# Patient Record
Sex: Male | Born: 1970 | Race: White | Hispanic: No | Marital: Married | Smoking: Never smoker
Health system: Southern US, Community
[De-identification: ages and names within clinical notes are randomized; demographics above are authoritative.]

## PROBLEM LIST (undated history)

## (undated) DIAGNOSIS — R51 Headache: Secondary | ICD-10-CM

## (undated) DIAGNOSIS — R519 Headache, unspecified: Secondary | ICD-10-CM

## (undated) DIAGNOSIS — J302 Other seasonal allergic rhinitis: Secondary | ICD-10-CM

## (undated) DIAGNOSIS — T7840XA Allergy, unspecified, initial encounter: Secondary | ICD-10-CM

## (undated) DIAGNOSIS — J45909 Unspecified asthma, uncomplicated: Secondary | ICD-10-CM

## (undated) HISTORY — DX: Allergy, unspecified, initial encounter: T78.40XA

## (undated) HISTORY — DX: Headache: R51

## (undated) HISTORY — DX: Headache, unspecified: R51.9

## (undated) HISTORY — DX: Unspecified asthma, uncomplicated: J45.909

## (undated) HISTORY — DX: Other seasonal allergic rhinitis: J30.2

---

## 2008-05-13 HISTORY — PX: KNEE SURGERY: SHX244

## 2009-09-20 ENCOUNTER — Emergency Department (HOSPITAL_COMMUNITY): Admission: EM | Admit: 2009-09-20 | Discharge: 2009-09-20 | Payer: Self-pay | Admitting: Emergency Medicine

## 2010-06-29 ENCOUNTER — Other Ambulatory Visit (HOSPITAL_COMMUNITY): Payer: Self-pay | Admitting: Family Medicine

## 2010-06-29 DIAGNOSIS — R110 Nausea: Secondary | ICD-10-CM

## 2010-06-29 DIAGNOSIS — R109 Unspecified abdominal pain: Secondary | ICD-10-CM

## 2010-06-29 DIAGNOSIS — R1011 Right upper quadrant pain: Secondary | ICD-10-CM

## 2010-07-02 ENCOUNTER — Other Ambulatory Visit (HOSPITAL_COMMUNITY): Payer: Self-pay | Admitting: Family Medicine

## 2010-07-02 ENCOUNTER — Ambulatory Visit (HOSPITAL_COMMUNITY)
Admission: RE | Admit: 2010-07-02 | Discharge: 2010-07-02 | Disposition: A | Discharge status: Home / Self Care | Payer: BC Managed Care – PPO | Source: Ambulatory Visit | Attending: Family Medicine | Admitting: Family Medicine

## 2010-07-02 ENCOUNTER — Encounter (HOSPITAL_COMMUNITY): Payer: Self-pay

## 2010-07-02 DIAGNOSIS — D739 Disease of spleen, unspecified: Secondary | ICD-10-CM | POA: Insufficient documentation

## 2010-07-02 DIAGNOSIS — R1011 Right upper quadrant pain: Secondary | ICD-10-CM

## 2010-07-02 DIAGNOSIS — R110 Nausea: Secondary | ICD-10-CM

## 2010-07-02 DIAGNOSIS — R109 Unspecified abdominal pain: Secondary | ICD-10-CM

## 2010-07-02 DIAGNOSIS — M549 Dorsalgia, unspecified: Secondary | ICD-10-CM

## 2010-07-02 DIAGNOSIS — R10A0 Flank pain, unspecified side: Secondary | ICD-10-CM

## 2010-07-02 DIAGNOSIS — R1031 Right lower quadrant pain: Secondary | ICD-10-CM | POA: Insufficient documentation

## 2010-07-02 DIAGNOSIS — R0609 Other forms of dyspnea: Secondary | ICD-10-CM

## 2010-07-02 MED ORDER — IOHEXOL 300 MG/ML  SOLN
100.0000 mL | Freq: Once | INTRAMUSCULAR | Status: AC | PRN
  Administered 2010-07-02: 100 mL via INTRAVENOUS

## 2010-07-05 ENCOUNTER — Other Ambulatory Visit (HOSPITAL_COMMUNITY): Payer: Self-pay | Admitting: Family Medicine

## 2010-07-11 ENCOUNTER — Ambulatory Visit (HOSPITAL_COMMUNITY)
Admission: RE | Admit: 2010-07-11 | Discharge: 2010-07-11 | Disposition: A | Discharge status: Home / Self Care | Payer: BC Managed Care – PPO | Source: Ambulatory Visit | Attending: Family Medicine | Admitting: Family Medicine

## 2010-07-11 DIAGNOSIS — D7389 Other diseases of spleen: Secondary | ICD-10-CM | POA: Insufficient documentation

## 2010-07-11 MED ORDER — GADOBENATE DIMEGLUMINE 529 MG/ML IV SOLN
15.0000 mL | Freq: Once | INTRAVENOUS | Status: AC
  Administered 2010-07-11: 15 mL via INTRAVENOUS

## 2012-04-17 ENCOUNTER — Ambulatory Visit (HOSPITAL_COMMUNITY)
Admission: RE | Admit: 2012-04-17 | Discharge: 2012-04-17 | Disposition: A | Discharge status: Home / Self Care | Payer: BC Managed Care – PPO | Source: Ambulatory Visit | Attending: Family Medicine | Admitting: Family Medicine

## 2012-04-17 ENCOUNTER — Other Ambulatory Visit: Payer: Self-pay | Admitting: Family Medicine

## 2012-04-17 DIAGNOSIS — M549 Dorsalgia, unspecified: Secondary | ICD-10-CM

## 2012-10-07 ENCOUNTER — Encounter: Payer: Self-pay | Admitting: *Deleted

## 2012-10-12 ENCOUNTER — Encounter: Payer: Self-pay | Admitting: Family Medicine

## 2012-10-12 ENCOUNTER — Ambulatory Visit (INDEPENDENT_AMBULATORY_CARE_PROVIDER_SITE_OTHER): Payer: BC Managed Care – PPO | Admitting: Family Medicine

## 2012-10-12 VITALS — BP 112/70 | HR 80 | Wt 171.1 lb

## 2012-10-12 DIAGNOSIS — Z0000 Encounter for general adult medical examination without abnormal findings: Secondary | ICD-10-CM

## 2012-10-12 MED ORDER — FLUTICASONE PROPIONATE 50 MCG/ACT NA SUSP: 2.0000 | Freq: Every day | NASAL | Status: DC | PRN

## 2012-10-12 NOTE — Progress Notes (Signed)
  Subjective:    Patient ID: Angel Baker, male    DOB: March 30, 1971, 42 y.o.   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 Has no particular complaints currently. Does have allergies use medication does help him keep it under control. He is up-to-date on tetanus shot. Denies any chest tightness pressure pain shortness of breath swelling in the legs rectal bleeding or hematuria.    Review of Systems  Constitutional: Negative for fever, activity change and appetite change.  HENT: Negative for congestion, rhinorrhea and neck pain.   Eyes: Negative for discharge.  Respiratory: Negative for cough and wheezing.   Cardiovascular: Negative for chest pain.  Gastrointestinal: Negative for vomiting, abdominal pain and blood in stool.  Genitourinary: Negative for frequency and difficulty urinating.  Skin: Negative for rash.  Allergic/Immunologic: Negative for environmental allergies and food allergies.  Neurological: Negative for weakness and headaches.  Psychiatric/Behavioral: Negative for agitation.       Objective:   Physical Exam  Vitals reviewed. Constitutional: He appears well-developed and well-nourished.  HENT:  Head: Normocephalic and atraumatic.  Right Ear: External ear normal.  Left Ear: External ear normal.  Nose: Nose normal.  Mouth/Throat: Oropharynx is clear and moist.  Eyes: EOM are normal. Pupils are equal, round, and reactive to light.  Neck: Normal range of motion. Neck supple. No thyromegaly present.  Cardiovascular: Normal rate, regular rhythm and normal heart sounds.   No murmur heard. Pulmonary/Chest: Effort normal and breath sounds normal. No respiratory distress. He has no wheezes.  Abdominal: Soft. Bowel sounds are normal. He exhibits no distension and no mass. There is no tenderness.  Genitourinary: Penis normal.  Musculoskeletal: Normal range of motion. He exhibits no edema.  Lymphadenopathy:    He has no cervical adenopathy.  Neurological: He  is alert. He exhibits normal muscle tone.  Skin: Skin is warm and dry. No erythema.  Psychiatric: He has a normal mood and affect. His behavior is normal. Judgment normal.          Assessment & Plan:  Wellness exam-labs recommended, up-to-date on tetanus, no warning signs noted. No other testing necessary. Continue allergy medicine. Followup yearly exam. Approved for scouts.

## 2012-10-19 LAB — LIPID PANEL
LDL Cholesterol: 100 mg/dL — ABNORMAL HIGH (ref 0–99)
Total CHOL/HDL Ratio: 3.3 Ratio
VLDL: 10 mg/dL (ref 0–40)

## 2012-10-19 LAB — GLUCOSE, RANDOM: Glucose, Bld: 99 mg/dL (ref 70–99)

## 2012-10-20 ENCOUNTER — Encounter: Payer: Self-pay | Admitting: Family Medicine

## 2013-09-06 ENCOUNTER — Encounter: Payer: Self-pay | Admitting: Family Medicine

## 2013-09-06 ENCOUNTER — Ambulatory Visit (HOSPITAL_COMMUNITY)
Admission: RE | Admit: 2013-09-06 | Discharge: 2013-09-06 | Disposition: A | Discharge status: Home / Self Care | Payer: BC Managed Care – PPO | Source: Ambulatory Visit | Attending: Family Medicine | Admitting: Family Medicine

## 2013-09-06 ENCOUNTER — Ambulatory Visit (INDEPENDENT_AMBULATORY_CARE_PROVIDER_SITE_OTHER): Payer: BC Managed Care – PPO | Admitting: Family Medicine

## 2013-09-06 VITALS — BP 122/80 | Ht 66.0 in | Wt 173.0 lb

## 2013-09-06 DIAGNOSIS — M79641 Pain in right hand: Secondary | ICD-10-CM

## 2013-09-06 DIAGNOSIS — R0789 Other chest pain: Secondary | ICD-10-CM

## 2013-09-06 DIAGNOSIS — M549 Dorsalgia, unspecified: Secondary | ICD-10-CM | POA: Insufficient documentation

## 2013-09-06 DIAGNOSIS — M546 Pain in thoracic spine: Secondary | ICD-10-CM

## 2013-09-06 DIAGNOSIS — E785 Hyperlipidemia, unspecified: Secondary | ICD-10-CM

## 2013-09-06 DIAGNOSIS — M79609 Pain in unspecified limb: Secondary | ICD-10-CM

## 2013-09-06 DIAGNOSIS — R29898 Other symptoms and signs involving the musculoskeletal system: Secondary | ICD-10-CM | POA: Insufficient documentation

## 2013-09-06 DIAGNOSIS — R109 Unspecified abdominal pain: Secondary | ICD-10-CM

## 2013-09-06 LAB — POCT URINALYSIS DIPSTICK: pH, UA: 6

## 2013-09-06 LAB — LIPID PANEL
CHOL/HDL RATIO: 4.1 ratio
Cholesterol: 190 mg/dL (ref 0–200)
HDL: 46 mg/dL (ref >39)
LDL Cholesterol: 127 mg/dL — ABNORMAL HIGH (ref 0–99)
TRIGLYCERIDES: 84 mg/dL (ref <150)
VLDL: 17 mg/dL (ref 0–40)

## 2013-09-06 LAB — CBC WITH DIFFERENTIAL/PLATELET
BASOS ABS: 0.1 10*3/uL (ref 0.0–0.1)
Basophils Relative: 1 % (ref 0–1)
EOS PCT: 3 % (ref 0–5)
Eosinophils Absolute: 0.2 10*3/uL (ref 0.0–0.7)
HEMATOCRIT: 47.4 % (ref 39.0–52.0)
Hemoglobin: 16.8 g/dL (ref 13.0–17.0)
LYMPHS ABS: 1.4 10*3/uL (ref 0.7–4.0)
LYMPHS PCT: 26 % (ref 12–46)
MCH: 29.5 pg (ref 26.0–34.0)
MCHC: 35.4 g/dL (ref 30.0–36.0)
MCV: 83.3 fL (ref 78.0–100.0)
MONOS PCT: 9 % (ref 3–12)
Monocytes Absolute: 0.5 10*3/uL (ref 0.1–1.0)
NEUTROS ABS: 3.2 10*3/uL (ref 1.7–7.7)
Neutrophils Relative %: 61 % (ref 43–77)
Platelets: 165 10*3/uL (ref 150–400)
RBC: 5.69 MIL/uL (ref 4.22–5.81)
RDW: 13.4 % (ref 11.5–15.5)
WBC: 5.2 10*3/uL (ref 4.0–10.5)

## 2013-09-06 LAB — HEPATIC FUNCTION PANEL
ALBUMIN: 4.8 g/dL (ref 3.5–5.2)
ALT: 17 U/L (ref 0–53)
AST: 16 U/L (ref 0–37)
Alkaline Phosphatase: 49 U/L (ref 39–117)
BILIRUBIN DIRECT: 0.1 mg/dL (ref 0.0–0.3)
BILIRUBIN TOTAL: 0.7 mg/dL (ref 0.2–1.2)
Indirect Bilirubin: 0.6 mg/dL (ref 0.2–1.2)
TOTAL PROTEIN: 6.9 g/dL (ref 6.0–8.3)

## 2013-09-06 LAB — BASIC METABOLIC PANEL
BUN: 12 mg/dL (ref 6–23)
CO2: 27 mEq/L (ref 19–32)
Calcium: 9.4 mg/dL (ref 8.4–10.5)
Chloride: 105 mEq/L (ref 96–112)
Creat: 0.98 mg/dL (ref 0.50–1.35)
GLUCOSE: 99 mg/dL (ref 70–99)
Potassium: 4.4 mEq/L (ref 3.5–5.3)
Sodium: 141 mEq/L (ref 135–145)

## 2013-09-06 NOTE — Progress Notes (Signed)
   Subjective:    Patient ID: Angel Baker, male    DOB: 1970-09-20, 43 y.o.   MRN: [id number]  Back Pain This is a recurrent problem. The current episode started more than 1 year ago. Radiates to: right side. The pain is worse during the night. (Tightness in chest) He has tried NSAIDs for the symptoms. The treatment provided mild relief.    Long discussion held patient greater than 25 minutes spent with patient.  Review of Systems  Musculoskeletal: Positive for back pain.   He denies coughing he does relate some chest tightness more so when he is doing physical activity. He denies severe abdominal pain but he gets a pain that starts in the mid back region radiates around to the right side. This is not associated with eating. Does not seem to be worse with movements.    Objective:   Physical Exam Lungs are clear heart is regular low spine minimal tenderness flanks nontender abdomen is soft no guarding or rebound. Extremities no edema. Neurologic grossly normal.       Assessment & Plan:  1. Chest tightness The chest discomfort he is unlikely to be cardiac but the patient relates that he's tried to do some increase exercise and he has noticed significant shortness of breath with it along with the chest tightness so it is concerning for the possibility I believe this patient would benefit from seeing cardiology for consultation. At the very least they can do a stress test make sure that looks good before he embarks on a vigorous exercise program - PR ELECTROCARDIOGRAM, COMPLETE; Standing - PR ELECTROCARDIOGRAM, COMPLETE  2. Thoracic back pain This flank discomfort that he is having I believe is more than likely related to his thoracic spine. We will do some x-rays that this may end up needing to have the MRI as well. The pain does not change with eating slight doubt that it's gallbladder. Also urinalysis was negative for any blood. Patient had a CT scan several years ago which was  negative for any type of kidney or liver abnormality - POCT urinalysis dipstick - DG Thoracic Spine W/Swimmers  3. Hand pain, right He has a cyst in his right hand this is going to need to see a hand specialist in be taking care. - DG Hand Complete Right; Future  4. Other and unspecified hyperlipidemia He's had mild hyperlipidemia in the past we will check profile await the results to us. - Lipid panel  5. Abdominal pain, unspecified site With the flank discomfort he is having we will do some lab work. - Hepatic function panel - Basic metabolic panel - CBC with Differential

## 2013-09-08 ENCOUNTER — Other Ambulatory Visit: Payer: Self-pay | Admitting: Family Medicine

## 2013-09-08 DIAGNOSIS — R0789 Other chest pain: Secondary | ICD-10-CM

## 2013-09-08 DIAGNOSIS — M79643 Pain in unspecified hand: Secondary | ICD-10-CM

## 2013-09-09 ENCOUNTER — Encounter: Payer: Self-pay | Admitting: Family Medicine

## 2013-09-13 ENCOUNTER — Ambulatory Visit (HOSPITAL_COMMUNITY)
Admission: RE | Admit: 2013-09-13 | Discharge: 2013-09-13 | Disposition: A | Discharge status: Home / Self Care | Payer: BC Managed Care – PPO | Source: Ambulatory Visit | Attending: Family Medicine | Admitting: Family Medicine

## 2013-09-13 DIAGNOSIS — M5124 Other intervertebral disc displacement, thoracic region: Secondary | ICD-10-CM | POA: Insufficient documentation

## 2013-09-13 DIAGNOSIS — M546 Pain in thoracic spine: Secondary | ICD-10-CM | POA: Insufficient documentation

## 2013-09-13 DIAGNOSIS — M47814 Spondylosis without myelopathy or radiculopathy, thoracic region: Secondary | ICD-10-CM | POA: Insufficient documentation

## 2013-09-13 DIAGNOSIS — M8448XA Pathological fracture, other site, initial encounter for fracture: Secondary | ICD-10-CM | POA: Insufficient documentation

## 2013-09-23 ENCOUNTER — Encounter: Payer: Self-pay | Admitting: Family Medicine

## 2013-09-24 ENCOUNTER — Ambulatory Visit (INDEPENDENT_AMBULATORY_CARE_PROVIDER_SITE_OTHER): Payer: BC Managed Care – PPO | Admitting: Cardiology

## 2013-09-24 ENCOUNTER — Encounter: Payer: Self-pay | Admitting: Cardiology

## 2013-09-24 VITALS — BP 125/75 | HR 63 | Ht 66.0 in | Wt 171.0 lb

## 2013-09-24 DIAGNOSIS — R072 Precordial pain: Secondary | ICD-10-CM | POA: Insufficient documentation

## 2013-09-24 DIAGNOSIS — J45909 Unspecified asthma, uncomplicated: Secondary | ICD-10-CM | POA: Insufficient documentation

## 2013-09-24 NOTE — Assessment & Plan Note (Signed)
Symptoms as outlined above, described as a "tightness" associated with shortness of breath, generally a sense that he has to take a deep breath in. Note purely exertional symptoms. Father had history of carotid artery disease and stroke. ECG reviewed above. He has never undergone any prior ischemic testing. Reports his symptoms are new in the last few months. Will obtain an exercise echocardiogram for objective evaluation. If this is low risk, consideration could be given to possibility that this might represent low-level reactive airways disease or perhaps exacerbation of allergy symptoms with the recent heavy pollen season. We will call him with the results.

## 2013-09-24 NOTE — Patient Instructions (Signed)
Your physician recommends that you schedule a follow-up appointment in: to be determined.We will call you with test results    Your physician recommends that you continue on your current medications as directed. Please refer to the Current Medication list given to you today.    Your physician has requested that you have a stress echocardiogram. For further information please visit [email or web address]. Please follow instruction sheet as given.    Thank you for choosing [facility] !

## 2013-09-24 NOTE — Assessment & Plan Note (Signed)
Mainly reports symptoms as a child. He no longer uses inhalers.

## 2013-09-24 NOTE — Progress Notes (Signed)
Clinical Summary [name] is a 43 y.o.male referred for cardiology consultation by Dr.Luking. He is reviewed below. He states that over the last few months he has been experiencing a "tightness" in his chest that is most noticeable in the morning when he gets up. As the day goes on and he is more active, symptoms are less evident unless he is particularly vigorous in his activity. He feels short of breath with this discomfort, like he has to take a deep breath in to feel satisfied. He does have a history of childhood asthma, no longer uses inhalers, also seasonal allergies.  Recent lab work from April showed cholesterol 190, triglycerides 84, HDL 46, LDL 127, normal LFTs, potassium 4.4, BUN 12, creatinine 0.9, hemoglobin 16.8, platelets 165. The recent ECG done in Dr. Fletcher AnonLuking's office is not available for my review today.  ECG today shows sinus arrhythmia with PVC, otherwise normal.  No major known cardiac risk factors. His father did have PAD with carotid artery disease and history of stroke, otherwise no obvious premature CAD in the family.  He has never gone undergone a prior objective ischemic testing.   No Known Allergies  Current Outpatient Prescriptions  Medication Sig Dispense Refill  . cetirizine (ZYRTEC) 10 MG tablet Take 10 mg by mouth daily.      . fluticasone (FLONASE) 50 MCG/ACT nasal spray Place 2 sprays into the nose daily as needed for rhinitis.  16 g  12   No current facility-administered medications for this visit.    Past Medical History  Diagnosis Date  . Asthma   . Seasonal allergies     History reviewed. No pertinent past surgical history.  Family History  Problem Relation Age of Onset  . Hypertension Father   . Stroke Father     Carotid artery disease    Social History Angel Baker reports that he has never smoked. He does not have any smokeless tobacco history on file. [name] reports that he does not drink alcohol.  Review of Systems No  palpitations, syncope, regular wheezing, hospitalizations for asthma the last several years. Stable appetite. Has gained some weight over the winter. Otherwise negative except as outlined.  Physical Examination Filed Vitals:   09/24/13 0915  BP: 125/75  Pulse: 63   Filed Weights   09/24/13 0915  Weight: 171 lb (77.565 kg)   Patient appears comfortable at rest. HEENT: Conjunctiva and lids normal, oropharynx clear. Neck: Supple, no elevated JVP or carotid bruits, no thyromegaly. Lungs: Clear to auscultation, nonlabored breathing at rest. Cardiac: Regular rate and rhythm with appropriate respiratory variation, no S3 or significant systolic murmur, no pericardial rub. Abdomen: Soft, nontender, bowel sounds present. Extremities: No pitting edema, distal pulses 2+. Skin: Warm and dry. Musculoskeletal: No kyphosis. Neuropsychiatric: Alert and oriented x3, affect grossly appropriate.   Problem List and Plan   Precordial pain Symptoms as outlined above, described as a "tightness" associated with shortness of breath, generally a sense that he has to take a deep breath in. Note purely exertional symptoms. Father had history of carotid artery disease and stroke. ECG reviewed above. He has never undergone any prior ischemic testing. Reports his symptoms are new in the last few months. Will obtain an exercise echocardiogram for objective evaluation. If this is low risk, consideration could be given to possibility that this might represent low-level reactive airways disease or perhaps exacerbation of allergy symptoms with the recent heavy pollen season. We will call him with the results.  Asthma  Mainly reports symptoms as a child. He no longer uses inhalers.    Angel SidleSamuel G. Baker, M.D., F.A.C.C.

## 2013-09-29 ENCOUNTER — Encounter (HOSPITAL_COMMUNITY): Payer: Self-pay

## 2013-09-29 ENCOUNTER — Ambulatory Visit (HOSPITAL_COMMUNITY)
Admission: RE | Admit: 2013-09-29 | Discharge: 2013-09-29 | Disposition: A | Discharge status: Home / Self Care | Payer: BC Managed Care – PPO | Source: Ambulatory Visit | Attending: Cardiology | Admitting: Cardiology

## 2013-09-29 DIAGNOSIS — R079 Chest pain, unspecified: Secondary | ICD-10-CM | POA: Insufficient documentation

## 2013-09-29 DIAGNOSIS — R5383 Other fatigue: Secondary | ICD-10-CM

## 2013-09-29 DIAGNOSIS — R0602 Shortness of breath: Secondary | ICD-10-CM | POA: Insufficient documentation

## 2013-09-29 DIAGNOSIS — R072 Precordial pain: Secondary | ICD-10-CM

## 2013-09-29 DIAGNOSIS — R5381 Other malaise: Secondary | ICD-10-CM | POA: Insufficient documentation

## 2013-09-29 NOTE — Progress Notes (Signed)
Stress Lab Nurses Notes - Angel Baker  [name] 09/29/2013 Reason for doing test: chest tightness with SOB Type of test: Stress Echo Nurse performing test: Parke PoissonPhyllis Billingsly, RN Nuclear Medicine Tech: Not Applicable Echo Tech: Veda Canningindy Rigg MD performing test: Koneswaran/M.Geni BersLenze PA Family MD: Lilyan PuntScott Luking Test explained and consent signed: yes IV started: No IV started Symptoms: Fatigue in legs Treatment/Intervention: None Reason test stopped: fatigue After recovery IV was: NA Patient to return to Nuc. Med at : NA Patient discharged: Home Patient's Condition upon discharge was: stable Comments: During test peak BP 179/78 & HR 190.  Recovery BP 150/83 & HR 112.  Symptoms resolved in recovery. Tawni MillersPhyllis T Edmundo Tedesco

## 2013-09-29 NOTE — Progress Notes (Signed)
  Echocardiogram 2D Echocardiogram has been performed.  Mariah Gerstenberger L Ciria Bernardini 09/29/2013, 10:20 AM

## 2014-12-28 ENCOUNTER — Encounter: Payer: Self-pay | Admitting: Family Medicine

## 2014-12-28 ENCOUNTER — Ambulatory Visit (INDEPENDENT_AMBULATORY_CARE_PROVIDER_SITE_OTHER): Payer: BLUE CROSS/BLUE SHIELD | Admitting: Family Medicine

## 2014-12-28 VITALS — BP 128/82 | Temp 99.2°F | Ht 66.0 in | Wt 168.0 lb

## 2014-12-28 DIAGNOSIS — R140 Abdominal distension (gaseous): Secondary | ICD-10-CM | POA: Diagnosis not present

## 2014-12-28 DIAGNOSIS — K2990 Gastroduodenitis, unspecified, without bleeding: Secondary | ICD-10-CM | POA: Diagnosis not present

## 2014-12-28 DIAGNOSIS — K2970 Gastritis, unspecified, without bleeding: Secondary | ICD-10-CM

## 2014-12-28 DIAGNOSIS — R1013 Epigastric pain: Secondary | ICD-10-CM

## 2014-12-28 MED ORDER — PANTOPRAZOLE SODIUM 40 MG PO TBEC: 40.0000 mg | DELAYED_RELEASE_TABLET | Freq: Every day | ORAL | Status: DC

## 2014-12-28 NOTE — Progress Notes (Addendum)
   Subjective:    Patient ID: Angel Baker, male    DOB: [birth year], 44 y.o.   MRN: [id number]  Abdominal Pain Episode onset: 2 months ago. The pain is located in the epigastric region. The quality of the pain is dull. Associated symptoms include nausea. Pertinent negatives include no constipation or diarrhea. He has tried nothing for the symptoms.  temp today 99.2.   this patient has been having a few months worth of epigastric wellness as well as bloating sensation some intermittent epigastric pain he takes anti-inflammatory is about 3 times a week he denies nausea or vomiting the patient denies alcohol use does not smoke. Does not have a history of ulcers. The pain is severe enough to where he takes Aleve 2 or 3 times a week to help patient states eating does not help or heard. Movement does not help or heard. The pain has become more progressive over the past several weeks in started a few months ago.  denies rectal bleeding denies weight loss Review of Systems  HENT: Negative for congestion.   Respiratory: Negative for cough.   Gastrointestinal: Positive for nausea and abdominal pain. Negative for diarrhea and constipation.       Objective:   Physical Exam   lungs clear heart regular abdomen soft minimal epigastric tenderness no guarding or rebound no masses      Assessment & Plan:   lab work order  PPI ordered  avoid  Anti-inflammatory's  patient to give [place] update in 2-3 weeks If ongoing troubles next step would be consideration for ultrasound as well as gastroenterology consult patient  possible gastritis may end up needing EGD  I doubt cancer, we will do testing may need scan or ultrasound if testing abnormal   Post note-lab work came back showing slight elevation of lipase. Raises into question pancreatitis with significant intermittent pain over the past few months that is getting progressively worse along with bloating sensation in the abdomen I feel that a CAT scan is  necessary for this patient to help delineate what is going on.

## 2014-12-29 LAB — LIPASE: LIPASE: 85 U/L — AB (ref 0–59)

## 2014-12-29 LAB — CBC WITH DIFFERENTIAL/PLATELET
BASOS ABS: 0 10*3/uL (ref 0.0–0.2)
Basos: 1 %
EOS (ABSOLUTE): 0.1 10*3/uL (ref 0.0–0.4)
Eos: 2 %
HEMOGLOBIN: 16.2 g/dL (ref 12.6–17.7)
Hematocrit: 46.8 % (ref 37.5–51.0)
Immature Grans (Abs): 0 10*3/uL (ref 0.0–0.1)
Immature Granulocytes: 0 %
LYMPHS ABS: 1.3 10*3/uL (ref 0.7–3.1)
Lymphs: 26 %
MCH: 29.9 pg (ref 26.6–33.0)
MCHC: 34.6 g/dL (ref 31.5–35.7)
MCV: 87 fL (ref 79–97)
MONOCYTES: 9 %
MONOS ABS: 0.5 10*3/uL (ref 0.1–0.9)
NEUTROS ABS: 3.3 10*3/uL (ref 1.4–7.0)
Neutrophils: 62 %
Platelets: 170 10*3/uL (ref 150–379)
RBC: 5.41 x10E6/uL (ref 4.14–5.80)
RDW: 13.7 % (ref 12.3–15.4)
WBC: 5.2 10*3/uL (ref 3.4–10.8)

## 2014-12-29 LAB — HEPATIC FUNCTION PANEL
ALT: 19 IU/L (ref 0–44)
AST: 18 IU/L (ref 0–40)
Albumin: 4.8 g/dL (ref 3.5–5.5)
Alkaline Phosphatase: 53 IU/L (ref 39–117)
BILIRUBIN TOTAL: 0.8 mg/dL (ref 0.0–1.2)
BILIRUBIN, DIRECT: 0.19 mg/dL (ref 0.00–0.40)
TOTAL PROTEIN: 6.9 g/dL (ref 6.0–8.5)

## 2014-12-29 LAB — BASIC METABOLIC PANEL
BUN / CREAT RATIO: 13 (ref 9–20)
BUN: 13 mg/dL (ref 6–24)
CO2: 26 mmol/L (ref 18–29)
CREATININE: 0.99 mg/dL (ref 0.76–1.27)
Calcium: 9.5 mg/dL (ref 8.7–10.2)
Chloride: 101 mmol/L (ref 97–108)
GFR calc Af Amer: 106 mL/min/{1.73_m2} (ref >59)
GFR calc non Af Amer: 92 mL/min/{1.73_m2} (ref >59)
GLUCOSE: 94 mg/dL (ref 65–99)
Potassium: 4.5 mmol/L (ref 3.5–5.2)
Sodium: 141 mmol/L (ref 134–144)

## 2014-12-29 LAB — AMYLASE: AMYLASE: 105 U/L (ref 31–124)

## 2014-12-30 ENCOUNTER — Other Ambulatory Visit: Payer: Self-pay

## 2014-12-30 DIAGNOSIS — R109 Unspecified abdominal pain: Secondary | ICD-10-CM

## 2015-01-04 ENCOUNTER — Encounter: Payer: Self-pay | Admitting: Internal Medicine

## 2015-01-04 ENCOUNTER — Encounter (HOSPITAL_COMMUNITY): Payer: Self-pay

## 2015-01-04 ENCOUNTER — Other Ambulatory Visit: Payer: Self-pay | Admitting: *Deleted

## 2015-01-04 ENCOUNTER — Ambulatory Visit (HOSPITAL_COMMUNITY)
Admission: RE | Admit: 2015-01-04 | Discharge: 2015-01-04 | Disposition: A | Discharge status: Home / Self Care | Payer: BLUE CROSS/BLUE SHIELD | Source: Ambulatory Visit | Attending: Family Medicine | Admitting: Family Medicine

## 2015-01-04 DIAGNOSIS — R109 Unspecified abdominal pain: Secondary | ICD-10-CM | POA: Insufficient documentation

## 2015-01-04 DIAGNOSIS — D739 Disease of spleen, unspecified: Secondary | ICD-10-CM | POA: Diagnosis not present

## 2015-01-04 MED ORDER — IOHEXOL 300 MG/ML  SOLN
100.0000 mL | Freq: Once | INTRAMUSCULAR | Status: AC | PRN
  Administered 2015-01-04: 100 mL via INTRAVENOUS

## 2015-01-05 LAB — AMYLASE: AMYLASE: 75 U/L (ref 31–124)

## 2015-01-05 LAB — LIPASE: LIPASE: 37 U/L (ref 0–59)

## 2015-01-11 ENCOUNTER — Ambulatory Visit (INDEPENDENT_AMBULATORY_CARE_PROVIDER_SITE_OTHER): Payer: BLUE CROSS/BLUE SHIELD | Admitting: Internal Medicine

## 2015-01-11 ENCOUNTER — Encounter: Payer: Self-pay | Admitting: Internal Medicine

## 2015-01-11 VITALS — BP 124/72 | HR 68 | Ht 65.5 in | Wt 168.0 lb

## 2015-01-11 DIAGNOSIS — R1013 Epigastric pain: Secondary | ICD-10-CM | POA: Diagnosis not present

## 2015-01-11 DIAGNOSIS — R748 Abnormal levels of other serum enzymes: Secondary | ICD-10-CM | POA: Diagnosis not present

## 2015-01-11 NOTE — Progress Notes (Signed)
  Referred by Dr. Lilyan Punt Subjective:    Patient ID: Angel Baker, male    DOB: 1970/10/10, 44 y.o.   MRN: [id number] Cc; epigastric pain HPI Is a very nice middle-aged married white man with a 2 month history of epigastric pain. Mild discomfort with bloating and some mild early satiety. No bleeding or dysphagia. Had mildly elvalted lipase but other CBC, CMEt ok. Amylsae ok. CT abd/pelvis negative. No bowel habit changes. Otherwise GI ROS negative. Started on PPI 2 weeks ago " I think I am a little better"  Wt Readings from Last 3 Encounters:  01/11/15 168 lb (76.204 kg)  12/28/14 168 lb (76.204 kg)  09/24/13 171 lb (77.565 kg)   Medications, allergies, past medical history, past surgical history, family history and social history are reviewed and updated in the EMR. Negative chest pain evaluation 2015 Review of Systems All other ROS negative or per HPI    Objective:   Physical Exam  124/72 mmHg  Pulse 68  Ht 5' 5.5" (1.664 m)  Wt 168 lb (76.204 kg)  BMI 27.52 kg/m2@  General:  Well-developed, well-nourished and in no acute distress Eyes:  anicteric. ENT:   Mouth and posterior pharynx free of lesions.  Neck:   supple w/o thyromegaly or mass.  Lungs: Clear to auscultation bilaterally. Heart:  S1S2, no rubs, murmurs, gallops. Abdomen:  soft, non-tender, no hepatosplenomegaly, hernia, or mass and BS+.  Lymph:  no cervical or supraclavicular adenopathy. Extremities:   no edema, cyanosis or clubbing Skin   no rash. Neuro:  A&O x 3.  Psych:  appropriate mood and  Affect.   Data Reviewed: PCP notes, labs CT abd/pelvis 12/2014 cardilology 2015    Assessment & Plan:  Abdominal pain, epigastric  Elevated lipase  Cause not clear but no sig worrisome features or danger sxs Lipase now NL  We have decide to stay on PPI - if not improving more in 2 weeks then schedule EGD. f improving, continue PPI and see me in 6-8 weeks.  I appreciate the opportunity to care for this  patient. YQ:MVHQI Gerda Diss, MD

## 2015-01-11 NOTE — Patient Instructions (Addendum)
Take your pantoprazole daily 30 minutes before breakfast.  If you are not feeling better in 2 weeks call us back as you may need an EGD.   We would like to see you mid October for follow up, appointment made for Oct. 13th at 11:00AM    I appreciate the opportunity to care for you. Stan Head, MD, [facility]

## 2015-02-23 ENCOUNTER — Encounter: Payer: Self-pay | Admitting: Internal Medicine

## 2015-02-23 ENCOUNTER — Other Ambulatory Visit: Payer: BLUE CROSS/BLUE SHIELD

## 2015-02-23 ENCOUNTER — Ambulatory Visit (INDEPENDENT_AMBULATORY_CARE_PROVIDER_SITE_OTHER): Payer: BLUE CROSS/BLUE SHIELD | Admitting: Internal Medicine

## 2015-02-23 VITALS — BP 120/80 | HR 78 | Ht 65.5 in | Wt 164.8 lb

## 2015-02-23 DIAGNOSIS — R1013 Epigastric pain: Secondary | ICD-10-CM

## 2015-02-23 NOTE — Patient Instructions (Signed)
   Please go to the lab for the H. Pylori test.  I will call the results and plans.  I appreciate the opportunity to care for you. Iva Booparl E. Gessner, MD, [facility] GrahamFACG

## 2015-02-23 NOTE — Progress Notes (Signed)
   Subjective:    Patient ID: Angel HailRodney Baker, male    DOB: 08/25/1970, 44 y.o.   MRN: [id number] Cc: epigastric pain HPI Here for f/u of epigastric pain. Initially seen this past summer and had started ppi pantoprazole and was improving so continued. He took that for about 1 month with some benefit. Now off the medication and sxs returning. No bleeding, dysphagia or unintentional weight loss. No sig heartburn sxs - more burning and gnawing epigastric pain Did have mildly elevated lipase also. Medications, allergies, past medical history, past surgical history, family history and social history are reviewed and updated in the EMR.  Review of Systems As above    Objective:   Physical Exam BP 120/80 mmHg  Pulse 78  Ht 5' 5.5" (1.664 m)  Wt 164 lb 12.8 oz (74.753 kg)  BMI 27.00 kg/m2 NAD    Assessment & Plan:  Abdominal pain, epigastric - Plan: H. pylori antigen, stool   He would prefer not to take medication long-term if possible. He will have stoo H. Pylori Ag test. If + treat and see If negative likely EGD for diagnostic purposes - ? Erosive esophagitis (? Epigastric pain as manifestation of GERD) or other abnormality like an ulcer. Transient lipase abnormality August - CT abd/pelvis was negative. 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   15 minutes time spent with patient > half in counseling coordination of care  WU:[id number]:Scott Gerda DissLuking, MD

## 2015-02-25 ENCOUNTER — Encounter: Payer: Self-pay | Admitting: Internal Medicine

## 2015-02-25 LAB — H. PYLORI ANTIGEN, STOOL: H PYLORI AG STL: NEGATIVE

## 2015-02-26 NOTE — Progress Notes (Signed)
Quick Note:  He does not have H. Pylori Plan was to do an EGD - please schedule if he is still ok with that I also want him to try hyoscyamine 0.125 mg SL q 4hrs prn abd pain # 60 no refill ______

## 2015-02-27 ENCOUNTER — Other Ambulatory Visit: Payer: Self-pay

## 2015-02-27 MED ORDER — HYOSCYAMINE SULFATE 0.125 MG SL SUBL: 0.1250 mg | SUBLINGUAL_TABLET | SUBLINGUAL | Status: DC | PRN

## 2015-04-13 ENCOUNTER — Encounter: Payer: Self-pay | Admitting: Family Medicine

## 2015-04-13 ENCOUNTER — Ambulatory Visit (INDEPENDENT_AMBULATORY_CARE_PROVIDER_SITE_OTHER): Payer: BLUE CROSS/BLUE SHIELD | Admitting: Family Medicine

## 2015-04-13 VITALS — BP 128/82 | Ht 66.0 in | Wt 167.8 lb

## 2015-04-13 DIAGNOSIS — R1032 Left lower quadrant pain: Secondary | ICD-10-CM

## 2015-04-13 DIAGNOSIS — M25519 Pain in unspecified shoulder: Secondary | ICD-10-CM

## 2015-04-13 DIAGNOSIS — M79629 Pain in unspecified upper arm: Secondary | ICD-10-CM

## 2015-04-13 NOTE — Progress Notes (Signed)
   Subjective:    Patient ID: Angel Baker, male    DOB: 01-12-1971, 44 y.o.   MRN: [id number]  HPI  Patietn arrives with c/o swollen lymph nodes under his arms and in groin area. He has noticed the area is tender for 4-6 weeks. Patient relates that he is noticed soreness under his arms he is noticed soreness in the groin worse on the left than the right he notices over the past 4-6 weeks he denies fever chills sweats he denies nausea vomiting diarrhea or appetite change He is worried about the possibility of cancer. No weight loss.  Patient relates at times he wakes up at night has hard time getting back to sleep because he so worried he states during the day he sometimes worries in addition to this occasionally gets emotional denies being depressed but does state he has low energy toward doing things he normally does denies any particular outside stressors work is hectic but going well home life is going well Review of Systems  Constitutional: Positive for activity change. Negative for fever, chills, appetite change, fatigue and unexpected weight change.  HENT: Negative for congestion and mouth sores.   Respiratory: Negative for cough, choking and chest tightness.   Cardiovascular: Negative for chest pain.  Gastrointestinal: Negative for abdominal pain.  Endocrine: Negative for cold intolerance and heat intolerance.  Genitourinary: Negative for dysuria.  Musculoskeletal: Negative for back pain, joint swelling, arthralgias and gait problem.  Neurological: Negative for light-headedness, numbness and headaches.  Hematological: Positive for adenopathy (patient is concerned about possible adenopathy).  Psychiatric/Behavioral: Negative for behavioral problems and agitation.       Objective:   Physical Exam  Head and neck exam eardrums normal throat is normal inside of mouth normal neck no adenopathy no masses felt Lungs clear no crackles respiratory rate normal Heart regular no  murmurs Abdomen soft no masses Underneath both axilla are normal no adenopathy no growths Groin region no adenopathy no growths Moderate tenderness left groin region where 5 muscles attach no growth noted Testicular exam normal Extremities normal     25 minutes was spent with the patient. Greater than half the time was spent in discussion and answering questions and counseling regarding the issues that the patient came in for today.  Assessment & Plan:  Groin pain-this is related to where inner thigh muscles attach to the pelvis bone no masses detected recommend anti-inflammatory but wait until he gets EGD first  Axilla pain but no sign of any adenopathy.  Recheck patient in 4-6 weeks to reexamine these areas see the patient is doing reassured him that I did not find any evidence of cancer check CBC  Possibility of underlying anxiety related condition patient to consider SSRI but he would like to hold off currently we will discuss this further on his follow-up

## 2015-04-14 LAB — CBC WITH DIFFERENTIAL/PLATELET
BASOS ABS: 0 10*3/uL (ref 0.0–0.2)
Basos: 1 %
EOS (ABSOLUTE): 0.1 10*3/uL (ref 0.0–0.4)
Eos: 1 %
Hematocrit: 46 % (ref 37.5–51.0)
Hemoglobin: 16.1 g/dL (ref 12.6–17.7)
Immature Grans (Abs): 0 10*3/uL (ref 0.0–0.1)
Immature Granulocytes: 0 %
LYMPHS ABS: 1.4 10*3/uL (ref 0.7–3.1)
Lymphs: 27 %
MCH: 30.1 pg (ref 26.6–33.0)
MCHC: 35 g/dL (ref 31.5–35.7)
MCV: 86 fL (ref 79–97)
MONOS ABS: 0.3 10*3/uL (ref 0.1–0.9)
Monocytes: 7 %
NEUTROS ABS: 3.4 10*3/uL (ref 1.4–7.0)
Neutrophils: 64 %
Platelets: 191 10*3/uL (ref 150–379)
RBC: 5.35 x10E6/uL (ref 4.14–5.80)
RDW: 13 % (ref 12.3–15.4)
WBC: 5.2 10*3/uL (ref 3.4–10.8)

## 2015-04-19 ENCOUNTER — Ambulatory Visit (AMBULATORY_SURGERY_CENTER): Payer: Self-pay

## 2015-04-19 VITALS — Ht 66.0 in | Wt 171.2 lb

## 2015-04-19 DIAGNOSIS — R1013 Epigastric pain: Secondary | ICD-10-CM

## 2015-04-19 NOTE — Progress Notes (Signed)
No allergies to eggs or soy or flu shot No past problems with anesthesia No diet/weight loss meds No home oxygen  Has email and internet; refused emmi

## 2015-04-20 ENCOUNTER — Encounter: Payer: Self-pay | Admitting: Internal Medicine

## 2015-05-02 ENCOUNTER — Encounter: Payer: Self-pay | Admitting: Internal Medicine

## 2015-05-02 ENCOUNTER — Ambulatory Visit (AMBULATORY_SURGERY_CENTER): Payer: BLUE CROSS/BLUE SHIELD | Admitting: Internal Medicine

## 2015-05-02 VITALS — BP 102/62 | HR 55 | Temp 97.4°F | Resp 15 | Ht 66.0 in | Wt 171.0 lb

## 2015-05-02 DIAGNOSIS — R1013 Epigastric pain: Secondary | ICD-10-CM | POA: Diagnosis not present

## 2015-05-02 MED ORDER — SODIUM CHLORIDE 0.9 % IV SOLN: 500.0000 mL | INTRAVENOUS | Status: DC

## 2015-05-02 NOTE — Progress Notes (Signed)
Dental advisory given to patient 

## 2015-05-02 NOTE — Progress Notes (Signed)
A/ox3, pleased with MAC, report to  Celia RN 

## 2015-05-02 NOTE — Op Note (Signed)
[facility] [address] [place], [id number]   ENDOSCOPY PROCEDURE REPORT  PATIENT: Angel Baker, Ryatt  MR#: [id number] BIRTHDATE: [birth year] , 44  yrs. old GENDER: male ENDOSCOPIST: Iva Booparl E Nahuel Wilbert, MD, [facility] PROCEDURE DATE:  05/02/2015 PROCEDURE:  EGD, diagnostic ASA CLASS:     Class II INDICATIONS:  epigastric pain. MEDICATIONS: Propofol 200 mg IV and Monitored anesthesia care TOPICAL ANESTHETIC: none  DESCRIPTION OF PROCEDURE: After the risks benefits and alternatives of the procedure were thoroughly explained, informed consent was obtained.  The LB [id number] endoscope was introduced through the mouth and advanced to the second portion of the duodenum , Without limitations.  The instrument was slowly withdrawn as the mucosa was fully examined.      EXAM: The esophagus and gastroesophageal junction were completely normal in appearance.  The stomach was entered and closely examined.The antrum, angularis, and lesser curvature were well visualized, including a retroflexed view of the cardia and fundus. The stomach wall was normally distensable.  The scope passed easily through the pylorus into the duodenum.  Retroflexed views revealed no abnormalities.     The scope was then withdrawn from the patient and the procedure completed.  COMPLICATIONS: There were no immediate complications.  ENDOSCOPIC IMPRESSION: Normal appearing esophagus and GE junction, the stomach was well visualized and normal in appearance, normal appearing duodenum  RECOMMENDATIONS: Follow-up prn, CT abd, H.  pylori Ag and now EGD all negative.  He prefers not to take symptom-directed rx  REPEAT EXAM:  eSigned:  Iva Booparl E Emogene Muratalla, MD, [facility] 05/02/2015 3:54 PM    CC:Dr. Loran SentersSteven Luking and The Patient

## 2015-05-02 NOTE — Patient Instructions (Addendum)
Esophagus, stomach and duodenum all look normal. I do not know why you have had the pain but do not think anything bad going on.  I appreciate the opportunity to care for you. Iva Booparl E. Gessner, MD, [facility]   Discharge instructions given. Normal exam. Resume previous medications. YOU HAD AN ENDOSCOPIC PROCEDURE TODAY AT THE [facility]:   Refer to the procedure report that was given to you for any specific questions about what was found during the examination.  If the procedure report does not answer your questions, please call your gastroenterologist to clarify.  If you requested that your care partner not be given the details of your procedure findings, then the procedure report has been included in a sealed envelope for you to review at your convenience later.  YOU SHOULD EXPECT: Some feelings of bloating in the abdomen. Passage of more gas than usual.  Walking can help get rid of the air that was put into your GI tract during the procedure and reduce the bloating. If you had a lower endoscopy (such as a colonoscopy or flexible sigmoidoscopy) you may notice spotting of blood in your stool or on the toilet paper. If you underwent a bowel prep for your procedure, you may not have a normal bowel movement for a few days.  Please Note:  You might notice some irritation and congestion in your nose or some drainage.  This is from the oxygen used during your procedure.  There is no need for concern and it should clear up in a day or so.  SYMPTOMS TO REPORT IMMEDIATELY:    Following upper endoscopy (EGD)  Vomiting of blood or coffee ground material  New chest pain or pain under the shoulder blades  Painful or persistently difficult swallowing  New shortness of breath  Fever of 100F or higher  Black, tarry-looking stools  For urgent or emergent issues, a gastroenterologist can be reached at any hour by calling [phone].   DIET: Your first meal following the procedure  should be a small meal and then it is ok to progress to your normal diet. Heavy or fried foods are harder to digest and may make you feel nauseous or bloated.  Likewise, meals heavy in dairy and vegetables can increase bloating.  Drink plenty of fluids but you should avoid alcoholic beverages for 24 hours.  ACTIVITY:  You should plan to take it easy for the rest of today and you should NOT DRIVE or use heavy machinery until tomorrow (because of the sedation medicines used during the test).    FOLLOW UP: Our staff will call the number listed on your records the next business day following your procedure to check on you and address any questions or concerns that you may have regarding the information given to you following your procedure. If we do not reach you, we will leave a message.  However, if you are feeling well and you are not experiencing any problems, there is no need to return our call.  We will assume that you have returned to your regular daily activities without incident.  If any biopsies were taken you will be contacted by phone or by letter within the next 1-3 weeks.  Please call us at [phone] if you have not heard about the biopsies in 3 weeks.    SIGNATURES/CONFIDENTIALITY: You and/or your care partner have signed paperwork which will be entered into your electronic medical record.  These signatures attest to the fact that that  the information above on your After Visit Summary has been reviewed and is understood.  Full responsibility of the confidentiality of this discharge information lies with you and/or your care-partner.

## 2015-05-03 ENCOUNTER — Telehealth: Payer: Self-pay

## 2015-05-03 NOTE — Telephone Encounter (Signed)
  Follow up Call-  Call back number 05/02/2015  Post procedure Call Back phone  # [phone]  Permission to leave phone message Yes     Patient questions:  Do you have a fever, pain , or abdominal swelling? No. Pain Score  0 *  Have you tolerated food without any problems? Yes.    Have you been able to return to your normal activities? Yes.    Do you have any questions about your discharge instructions: Diet   No. Medications  No. Follow up visit  No.  Do you have questions or concerns about your Care? No.  Actions: * If pain score is 4 or above: No action needed, pain <4.

## 2017-04-21 ENCOUNTER — Ambulatory Visit: Payer: BLUE CROSS/BLUE SHIELD | Admitting: Family Medicine

## 2017-04-22 ENCOUNTER — Encounter: Payer: Self-pay | Admitting: Family Medicine

## 2017-04-22 ENCOUNTER — Ambulatory Visit (INDEPENDENT_AMBULATORY_CARE_PROVIDER_SITE_OTHER): Payer: BLUE CROSS/BLUE SHIELD | Admitting: Family Medicine

## 2017-04-22 VITALS — BP 130/82 | Temp 98.7°F | Ht 66.0 in | Wt 157.0 lb

## 2017-04-22 DIAGNOSIS — R51 Headache: Secondary | ICD-10-CM

## 2017-04-22 DIAGNOSIS — R519 Headache, unspecified: Secondary | ICD-10-CM

## 2017-04-22 NOTE — Progress Notes (Signed)
   Subjective:    Patient ID: Angel Baker, male    DOB: 1970-10-23, 46 y.o.   MRN: [id number]  HPI  Patient is here today with complaints of headaches off and on for months now and they are becoming more frequent. He is taking naproxen alt with Tylenol or Excedrin. No sensitivity to light, no N&V. He can not say it is more so depending upon what he eats.He states the headache is mostly over the right eye and some times feels it is behind the right eye.Patient with significant discomfort and pain behind the right eye on the right side this been present over the past 6 months at least 20 days out of 30 days he has moderate to severe headache no vomiting with it no blurred vision or double vision it does wake him up at night on a regular basis he does not know of any triggers does not smoke does not drink does not use any drugs does use Tylenol and Excedrin on a regular basis denies personal or family history of this Review of Systems Denies fever chills denies blurred vision denies vomiting diarrhea denies neck stiffness denies chest tightness pressure pain or shortness of breath    Objective:   Physical Exam Optic disks appear sharp pupils responsive to light no unilateral numbness weakness neurologic grossly normal neck no masses does have a skin lesion on the right side of his neck very small Lungs are clear respiratory rate normal heart regular pulse normal BP good      Assessment & Plan:  Frequent headaches Patient just started having these headaches over the past 6 months there on and almost daily basis.  They do cause significant problems occurring at nighttime as well waking him up.  Because of him not having a headache history being near 46 years of age plus also having headaches that wake him up in the middle the night I recommend that the patient go ahead with MRI study  I have offered Topamax at this point in time the patient does not want to be on this he would like to wait to the  MRI comes back  The patient will avoid anti-inflammatories regular Tylenol use and Excedrin Migraine use  Skin lesion on the side of his neck I recommend that the patient be seen by dermatology he will set up appointment with Dr. Charlton HawsMcConnell  Follow-up will be based upon MRI lab work ordered

## 2017-04-23 LAB — CBC WITH DIFFERENTIAL/PLATELET
Basophils Absolute: 0 10*3/uL (ref 0.0–0.2)
Basos: 1 %
EOS (ABSOLUTE): 0.1 10*3/uL (ref 0.0–0.4)
Eos: 1 %
Hematocrit: 45.1 % (ref 37.5–51.0)
Hemoglobin: 14.9 g/dL (ref 13.0–17.7)
IMMATURE GRANS (ABS): 0 10*3/uL (ref 0.0–0.1)
IMMATURE GRANULOCYTES: 0 %
LYMPHS: 20 %
Lymphocytes Absolute: 1.3 10*3/uL (ref 0.7–3.1)
MCH: 30 pg (ref 26.6–33.0)
MCHC: 33 g/dL (ref 31.5–35.7)
MCV: 91 fL (ref 79–97)
MONOS ABS: 0.5 10*3/uL (ref 0.1–0.9)
Monocytes: 8 %
NEUTROS PCT: 70 %
Neutrophils Absolute: 4.4 10*3/uL (ref 1.4–7.0)
PLATELETS: 162 10*3/uL (ref 150–379)
RBC: 4.97 x10E6/uL (ref 4.14–5.80)
RDW: 13.5 % (ref 12.3–15.4)
WBC: 6.2 10*3/uL (ref 3.4–10.8)

## 2017-04-23 LAB — SEDIMENTATION RATE: Sed Rate: 2 mm/hr (ref 0–15)

## 2017-04-30 ENCOUNTER — Other Ambulatory Visit: Payer: Self-pay | Admitting: Family Medicine

## 2017-04-30 ENCOUNTER — Ambulatory Visit (HOSPITAL_COMMUNITY)
Admission: RE | Admit: 2017-04-30 | Discharge: 2017-04-30 | Disposition: A | Discharge status: Home / Self Care | Payer: BLUE CROSS/BLUE SHIELD | Source: Ambulatory Visit | Attending: Family Medicine | Admitting: Family Medicine

## 2017-04-30 DIAGNOSIS — R51 Headache: Secondary | ICD-10-CM | POA: Insufficient documentation

## 2017-04-30 DIAGNOSIS — R519 Headache, unspecified: Secondary | ICD-10-CM

## 2017-04-30 MED ORDER — TOPIRAMATE 50 MG PO TABS: ORAL_TABLET | ORAL | 3 refills | Status: DC

## 2017-05-19 ENCOUNTER — Encounter: Payer: Self-pay | Admitting: Family Medicine

## 2017-05-20 ENCOUNTER — Other Ambulatory Visit: Payer: Self-pay | Admitting: Family Medicine

## 2017-05-20 DIAGNOSIS — R51 Headache: Principal | ICD-10-CM

## 2017-05-20 DIAGNOSIS — R519 Headache, unspecified: Secondary | ICD-10-CM

## 2017-05-20 NOTE — Progress Notes (Signed)
Nurses-please send in Topamax 50 mg 1 twice daily-verify with patient where to have this filled last time patient wanted somewhere other than [facility].  [facility]

## 2017-05-20 NOTE — Progress Notes (Signed)
I called and left a message asked that he r/c. 

## 2017-05-22 ENCOUNTER — Other Ambulatory Visit: Payer: Self-pay | Admitting: *Deleted

## 2017-05-22 MED ORDER — TOPIRAMATE 50 MG PO TABS: ORAL_TABLET | ORAL | 0 refills | Status: DC

## 2017-05-22 NOTE — Addendum Note (Signed)
Addended by: Metro KungICHARDS, WENDY M on: 05/22/2017 11:05 AM   Modules accepted: Orders

## 2017-05-22 NOTE — Progress Notes (Signed)
Discussed with pt. Pt verbalized understanding. [facility] [place]. Med sent. Referral put in.

## 2017-05-28 ENCOUNTER — Encounter: Payer: Self-pay | Admitting: Family Medicine

## 2017-06-03 ENCOUNTER — Encounter: Payer: Self-pay | Admitting: Neurology

## 2017-06-03 ENCOUNTER — Ambulatory Visit (INDEPENDENT_AMBULATORY_CARE_PROVIDER_SITE_OTHER): Payer: BLUE CROSS/BLUE SHIELD | Admitting: Neurology

## 2017-06-03 VITALS — BP 128/73 | HR 69 | Ht 66.0 in | Wt 148.8 lb

## 2017-06-03 DIAGNOSIS — R51 Headache: Secondary | ICD-10-CM

## 2017-06-03 DIAGNOSIS — R519 Headache, unspecified: Secondary | ICD-10-CM

## 2017-06-03 DIAGNOSIS — R350 Frequency of micturition: Secondary | ICD-10-CM

## 2017-06-03 MED ORDER — NORTRIPTYLINE HCL 10 MG PO CAPS
20.0000 mg | ORAL_CAPSULE | Freq: Every day | ORAL | 11 refills | Status: DC
Start: 2017-06-03 — End: 2017-06-04

## 2017-06-03 MED ORDER — INDOMETHACIN 50 MG PO CAPS: 50.0000 mg | ORAL_CAPSULE | Freq: Two times a day (BID) | ORAL | 5 refills | Status: DC

## 2017-06-03 MED ORDER — RIZATRIPTAN BENZOATE 10 MG PO TABS: 10.0000 mg | ORAL_TABLET | ORAL | 11 refills | Status: DC | PRN

## 2017-06-03 NOTE — Progress Notes (Signed)
PATIENT: Angel Baker DOB: 26-Mar-1971  Chief Complaint  Patient presents with  . Headache    He is here with his wife, Angel Baker.  He has been having daily headaches for the last eight months.  He has been taking topiramate 68m, BID without improvement in headache frequency.  He also has complaints of being in a cognitive fog and tingling in fingers/toes since starting this medication.  Six weeks ago, he was taking ibuprofen and naproxen several times daily.  He has cut back on his NSAID use but is still taking ibuprofen 4024m 1-2 times daily.  He has a recent normal brain MRI.   . Marland KitchenCP    LuKathyrn DrownMD     HISTORICAL  RoMarkise Baker a [age]ear old male, seen in refer by primary care doctor Angel Baker, for evaluation of new onset headaches, initial evaluation was June 03, 2017.  He was previously healthy, deny a history of headache, presented with new onset headaches since May 2018, he contributed to his busy working schedule initially, but is intermittent right frontal retro-orbital area headache become constant over time, he now complains of 3 out of 10 daily headaches, sometimes exacerbated to 6 out of 10, there was no significant light noise sensitivity, no nausea,  He has been taking frequent Aleve, ibuprofen, occasionally Tylenol on a daily basis without helping his headache much, he was given prescription of Topamax 50 mg twice a day since December 2018, he complains of mild mental slowing, numbness tingling, there was no significant improvement of his headaches.  He also tried 1 tablets of Imitrex 100 mg, which did not help his headache,  Wife noticed patient suffer mild depression, he began to run marathon recently Personally reviewed MRI of the brain without contrast in December 2018 that was normal,  Normal ESR, 2, CBC,   REVIEW OF SYSTEMS: Full 14 system review of systems performed and notable only for headache, decreased energy, disinterested in  activities, fatigue  ALLERGIES: No Known Allergies  HOME MEDICATIONS: Current Outpatient Medications  Medication Sig Dispense Refill  . fluticasone (FLONASE) 50 MCG/ACT nasal spray Place 1 spray into both nostrils daily as needed for allergies or rhinitis.    . Marland Kitchenbuprofen (ADVIL,MOTRIN) 400 MG tablet Take 400 mg by mouth daily as needed.    . loratadine (CLARITIN) 10 MG tablet Take 10 mg by mouth as needed for allergies.    . Marland Kitchenopiramate (TOPAMAX) 50 MG tablet Take one bid 60 tablet 0   No current facility-administered medications for this visit.     PAST MEDICAL HISTORY: Past Medical History:  Diagnosis Date  . Asthma   . Headache   . Seasonal allergies     PAST SURGICAL HISTORY: Past Surgical History:  Procedure Laterality Date  . KNEE SURGERY Left 2010    FAMILY HISTORY: Family History  Problem Relation Age of Onset  . Hypertension Father   . Stroke Father        Carotid artery disease  . Cancer - Other Father        throat  . Hiatal hernia Mother   . Heart attack Maternal Grandmother   . Emphysema Maternal Grandfather   . Diabetes Paternal Grandmother   . Colon cancer Neg Hx     SOCIAL HISTORY:  Social History   Socioeconomic History  . Marital status: Married    Spouse name: Not on file  . Number of children: 2  . Years of education: 1668. Highest education  level: Bachelor's degree (e.g., BA, AB, BS)  Social Needs  . Financial resource strain: Not on file  . Food insecurity - worry: Not on file  . Food insecurity - inability: Not on file  . Transportation needs - medical: Not on file  . Transportation needs - non-medical: Not on file  Occupational History  . Occupation: Engineer, materials  Tobacco Use  . Smoking status: Never Smoker  . Smokeless tobacco: Never Used  Substance and Sexual Activity  . Alcohol use: Yes    Alcohol/week: 0.6 oz    Types: 1 Standard drinks or equivalent per week    Comment: occasionally/beer  . Drug use:  No  . Sexual activity: Not on file  Other Topics Concern  . Not on file  Social History Narrative   Lives at home with his wife.   Right-handed.   2 cup caffeine per day.     PHYSICAL EXAM   Vitals:   06/03/17 0728  BP: 128/73  Pulse: 69  Weight: 148 lb 12 oz (67.5 kg)  Height: _0  (1.676 m)    Not recorded      Body mass index is 24.01 kg/m.  PHYSICAL EXAMNIATION:  Gen: NAD, conversant, well nourised, obese, well groomed                     Cardiovascular: Regular rate rhythm, no peripheral edema, warm, nontender. Eyes: Conjunctivae clear without exudates or hemorrhage Neck: Supple, no carotid bruits. Pulmonary: Clear to auscultation bilaterally   NEUROLOGICAL EXAM:  MENTAL STATUS: Speech:    Speech is normal; fluent and spontaneous with normal comprehension.  Cognition:     Orientation to time, place and person     Normal recent and remote memory     Normal Attention span and concentration     Normal Language, naming, repeating,spontaneous speech     Fund of knowledge   CRANIAL NERVES: CN II: Visual fields are full to confrontation. Fundoscopic exam is normal with sharp discs and no vascular changes. Pupils are round equal and briskly reactive to light. CN III, IV, VI: extraocular movement are normal. No ptosis. CN V: Facial sensation is intact to pinprick in all 3 divisions bilaterally. Corneal responses are intact.  CN VII: Face is symmetric with normal eye closure and smile. CN VIII: Hearing is normal to rubbing fingers CN IX, X: Palate elevates symmetrically. Phonation is normal. CN XI: Head turning and shoulder shrug are intact CN XII: Tongue is midline with normal movements and no atrophy.  MOTOR: There is no pronator drift of out-stretched arms. Muscle bulk and tone are normal. Muscle strength is normal.  REFLEXES: Reflexes are 2+ and symmetric at the biceps, triceps, knees, and ankles. Plantar responses are flexor.  SENSORY: Intact to light  touch, pinprick, positional sensation and vibratory sensation are intact in fingers and toes.  COORDINATION: Rapid alternating movements and fine finger movements are intact. There is no dysmetria on finger-to-nose and heel-knee-shin.    GAIT/STANCE: Posture is normal. Gait is steady with normal steps, base, arm swing, and turning. Heel and toe walking are normal. Tandem gait is normal.  Romberg is absent.   DIAGNOSTIC DATA (LABS, IMAGING, TESTING) - I reviewed patient records, labs, notes, testing and imaging myself where available.   ASSESSMENT AND PLAN  Angel Baker is a 47 y.o. male   New onset right side headache  Normal MRI brain, neurological exam  Dx include hemicranium continnum  Laboratory evaluations  Start indomethacin  50 mg twice a day  Maxalt 10 mg as needed  Nortriptyline 10 mg titrating to 20 mg every night as preventive medications   Marcial Pacas, M.D. Ph.D.  [facility] [address] [place], [place] [address] Ph: [phone] Fax: [phone]  CC: Angel Drown, MD

## 2017-06-04 ENCOUNTER — Telehealth: Payer: Self-pay | Admitting: Neurology

## 2017-06-04 LAB — COMPREHENSIVE METABOLIC PANEL
A/G RATIO: 2 (ref 1.2–2.2)
ALT: 26 IU/L (ref 0–44)
AST: 20 IU/L (ref 0–40)
Albumin: 4.7 g/dL (ref 3.5–5.5)
Alkaline Phosphatase: 50 IU/L (ref 39–117)
BUN/Creatinine Ratio: 13 (ref 9–20)
BUN: 13 mg/dL (ref 6–24)
Bilirubin Total: 0.3 mg/dL (ref 0.0–1.2)
CALCIUM: 9.5 mg/dL (ref 8.7–10.2)
CO2: 23 mmol/L (ref 20–29)
Chloride: 107 mmol/L — ABNORMAL HIGH (ref 96–106)
Creatinine, Ser: 1.02 mg/dL (ref 0.76–1.27)
GFR calc Af Amer: 100 mL/min/{1.73_m2} (ref >59)
GFR, EST NON AFRICAN AMERICAN: 87 mL/min/{1.73_m2} (ref >59)
Globulin, Total: 2.3 g/dL (ref 1.5–4.5)
Glucose: 101 mg/dL — ABNORMAL HIGH (ref 65–99)
POTASSIUM: 4.7 mmol/L (ref 3.5–5.2)
Sodium: 142 mmol/L (ref 134–144)
Total Protein: 7 g/dL (ref 6.0–8.5)

## 2017-06-04 LAB — HGB A1C W/O EAG: HEMOGLOBIN A1C: 5.3 % (ref 4.8–5.6)

## 2017-06-04 LAB — URINALYSIS
Bilirubin, UA: NEGATIVE
Glucose, UA: NEGATIVE
Ketones, UA: NEGATIVE
LEUKOCYTES UA: NEGATIVE
Nitrite, UA: NEGATIVE
PH UA: 7 (ref 5.0–7.5)
PROTEIN UA: NEGATIVE
RBC UA: NEGATIVE
SPEC GRAV UA: 1.011 (ref 1.005–1.030)
Urobilinogen, Ur: 0.2 mg/dL (ref 0.2–1.0)

## 2017-06-04 LAB — SEDIMENTATION RATE: Sed Rate: 2 mm/hr (ref 0–15)

## 2017-06-04 LAB — TSH: TSH: 1.85 u[IU]/mL (ref 0.450–4.500)

## 2017-06-04 LAB — C-REACTIVE PROTEIN: CRP: 0.7 mg/L (ref 0.0–4.9)

## 2017-06-04 LAB — VITAMIN D 25 HYDROXY (VIT D DEFICIENCY, FRACTURES): Vit D, 25-Hydroxy: 38.8 ng/mL (ref 30.0–100.0)

## 2017-06-04 MED ORDER — NORTRIPTYLINE HCL 10 MG PO CAPS: 20.0000 mg | ORAL_CAPSULE | Freq: Every day | ORAL | 11 refills | Status: DC

## 2017-06-04 MED ORDER — INDOMETHACIN 50 MG PO CAPS: 50.0000 mg | ORAL_CAPSULE | Freq: Two times a day (BID) | ORAL | 5 refills | Status: DC

## 2017-06-04 MED ORDER — RIZATRIPTAN BENZOATE 10 MG PO TABS: 10.0000 mg | ORAL_TABLET | ORAL | 11 refills | Status: DC | PRN

## 2017-06-04 NOTE — Addendum Note (Signed)
Addended by: Lindell SparKIRKMAN, Baleigh Rennaker C on: 06/04/2017 02:59 PM   Modules accepted: Orders

## 2017-06-04 NOTE — Telephone Encounter (Signed)
Pt wife(on DPR) has called asking if [facility] [place], [place] - [address] [phone] (Phone) [phone] (Fax)   Wife asking if above pharmacy can be removed due to having new insurance,  Pt requested the medications be sent to  [id number] - [place], [place] - [address] AT [facility] [phone] (Phone) [phone] (Fax)   Pt wife has not requested a call back

## 2017-06-04 NOTE — Telephone Encounter (Signed)
Pharmacy updated and prescriptions resent.

## 2017-06-22 ENCOUNTER — Other Ambulatory Visit: Payer: Self-pay | Admitting: Family Medicine

## 2017-07-16 ENCOUNTER — Ambulatory Visit: Payer: BLUE CROSS/BLUE SHIELD | Admitting: Neurology

## 2017-08-07 ENCOUNTER — Encounter: Payer: Self-pay | Admitting: Neurology

## 2017-08-07 ENCOUNTER — Ambulatory Visit (INDEPENDENT_AMBULATORY_CARE_PROVIDER_SITE_OTHER): Payer: BLUE CROSS/BLUE SHIELD | Admitting: Neurology

## 2017-08-07 VITALS — BP 130/77 | HR 71 | Ht 66.0 in | Wt 160.0 lb

## 2017-08-07 DIAGNOSIS — G8929 Other chronic pain: Secondary | ICD-10-CM | POA: Insufficient documentation

## 2017-08-07 DIAGNOSIS — R51 Headache: Secondary | ICD-10-CM | POA: Diagnosis not present

## 2017-08-07 DIAGNOSIS — R519 Headache, unspecified: Secondary | ICD-10-CM

## 2017-08-07 MED ORDER — NORTRIPTYLINE HCL 25 MG PO CAPS: 50.0000 mg | ORAL_CAPSULE | Freq: Every day | ORAL | 11 refills | Status: DC

## 2017-08-07 MED ORDER — INDOMETHACIN 50 MG PO CAPS: 50.0000 mg | ORAL_CAPSULE | Freq: Two times a day (BID) | ORAL | 5 refills | Status: DC | PRN

## 2017-08-07 NOTE — Progress Notes (Signed)
PATIENT: Angel Baker DOB: 1970/11/05  Chief Complaint  Patient presents with  . Frequent headaches    He is here with his wife, Sula Soda. His headaches still occur nearly everyday.  However, he feels they are shorter in duration and less intense since starting Indomethacin.     HISTORICAL  Angel Baker is a 47 year old male, seen in refer by primary care doctor Sallee Lange A, for evaluation of new onset headaches, initial evaluation was June 03, 2017.  He was previously healthy, deny a history of headache, presented with new onset headaches since May 2018, he contributed to his busy working schedule initially, but is intermittent right frontal retro-orbital area headache become constant over time, he now complains of 3 out of 10 daily headaches, sometimes exacerbated to 6 out of 10, there was no significant light noise sensitivity, no nausea,  He has been taking frequent Aleve, ibuprofen, occasionally Tylenol on a daily basis without helping his headache much, he was given prescription of Topamax 50 mg twice a day since December 2018, he complains of mild mental slowing, numbness tingling, there was no significant improvement of his headaches.  He also tried 1 tablets of Imitrex 100 mg, which did not help his headache,  Wife noticed patient suffer mild depression, he began to run marathon recently Personally reviewed MRI of the brain without contrast in December 2018 that was normal,  Normal ESR, 2, CBC,  UPDATE August 07 2017: He still almost daily headache, mild, right side, often woke up with headaches, he takes nortriptyline 20 mg every night, which did help his sleep some, he still wake up a few times every night, mild snoring, but no choking episode, he takes indomethacin 50 mg twice a day, was able to finish his marathon run  REVIEW OF SYSTEMS: Full 14 system review of systems performed and notable only for frequent urination, headaches No Known Allergies  HOME  MEDICATIONS: Current Outpatient Medications  Medication Sig Dispense Refill  . fluticasone (FLONASE) 50 MCG/ACT nasal spray Place 1 spray into both nostrils daily as needed for allergies or rhinitis.    Marland Kitchen ibuprofen (ADVIL,MOTRIN) 400 MG tablet Take 400 mg by mouth daily as needed.    . indomethacin (INDOCIN) 50 MG capsule Take 1 capsule (50 mg total) by mouth 2 (two) times daily with a meal. 60 capsule 5  . loratadine (CLARITIN) 10 MG tablet Take 10 mg by mouth as needed for allergies.    Marland Kitchen nortriptyline (PAMELOR) 10 MG capsule Take 2 capsules (20 mg total) by mouth at bedtime. 60 capsule 11  . rizatriptan (MAXALT) 10 MG tablet Take 1 tablet (10 mg total) by mouth as needed for migraine. Repeat in 2 hours if needed.  Max 2 tab/24 hrs or 12 tabs/month 12 tablet 11   No current facility-administered medications for this visit.     PAST MEDICAL HISTORY: Past Medical History:  Diagnosis Date  . Asthma   . Headache   . Seasonal allergies     PAST SURGICAL HISTORY: Past Surgical History:  Procedure Laterality Date  . KNEE SURGERY Left 2010    FAMILY HISTORY: Family History  Problem Relation Age of Onset  . Hypertension Father   . Stroke Father        Carotid artery disease  . Cancer - Other Father        throat  . Hiatal hernia Mother   . Heart attack Maternal Grandmother   . Emphysema Maternal Grandfather   . Diabetes Paternal  Grandmother   . Colon cancer Neg Hx     SOCIAL HISTORY:  Social History   Socioeconomic History  . Marital status: Married    Spouse name: Not on file  . Number of children: 2  . Years of education: 16  . Highest education level: Bachelor's degree (e.g., BA, AB, BS)  Occupational History  . Occupation: Engineer, materials  Social Needs  . Financial resource strain: Not on file  . Food insecurity:    Worry: Not on file    Inability: Not on file  . Transportation needs:    Medical: Not on file    Non-medical: Not on file  Tobacco  Use  . Smoking status: Never Smoker  . Smokeless tobacco: Never Used  Substance and Sexual Activity  . Alcohol use: Yes    Alcohol/week: 0.6 oz    Types: 1 Standard drinks or equivalent per week    Comment: occasionally/beer  . Drug use: No  . Sexual activity: Not on file  Lifestyle  . Physical activity:    Days per week: Not on file    Minutes per session: Not on file  . Stress: Not on file  Relationships  . Social connections:    Talks on phone: Not on file    Gets together: Not on file    Attends religious service: Not on file    Active member of club or organization: Not on file    Attends meetings of clubs or organizations: Not on file    Relationship status: Not on file  . Intimate partner violence:    Fear of current or ex partner: Not on file    Emotionally abused: Not on file    Physically abused: Not on file    Forced sexual activity: Not on file  Other Topics Concern  . Not on file  Social History Narrative   Lives at home with his wife.   Right-handed.   2 cup caffeine per day.     PHYSICAL EXAM   Vitals:   08/07/17 0722  BP: 130/77  Pulse: 71  Weight: 160 lb (72.6 kg)  Height: '5\' 6"'  (1.676 m)    Not recorded      Body mass index is 25.82 kg/m.  PHYSICAL EXAMNIATION:  Gen: NAD, conversant, well nourised, obese, well groomed                     Cardiovascular: Regular rate rhythm, no peripheral edema, warm, nontender. Eyes: Conjunctivae clear without exudates or hemorrhage Neck: Supple, no carotid bruits. Pulmonary: Clear to auscultation bilaterally   NEUROLOGICAL EXAM:  MENTAL STATUS: Speech:    Speech is normal; fluent and spontaneous with normal comprehension.  Cognition:     Orientation to time, place and person     Normal recent and remote memory     Normal Attention span and concentration     Normal Language, naming, repeating,spontaneous speech     Fund of knowledge   CRANIAL NERVES: CN II: Visual fields are full to  confrontation. Fundoscopic exam is normal with sharp discs and no vascular changes. Pupils are round equal and briskly reactive to light. CN III, IV, VI: extraocular movement are normal. No ptosis. CN V: Facial sensation is intact to pinprick in all 3 divisions bilaterally. Corneal responses are intact.  CN VII: Face is symmetric with normal eye closure and smile. CN VIII: Hearing is normal to rubbing fingers CN IX, X: Palate elevates symmetrically. Phonation is normal.  CN XI: Head turning and shoulder shrug are intact CN XII: Tongue is midline with normal movements and no atrophy.  MOTOR: There is no pronator drift of out-stretched arms. Muscle bulk and tone are normal. Muscle strength is normal.  REFLEXES: Reflexes are 2+ and symmetric at the biceps, triceps, knees, and ankles. Plantar responses are flexor.  SENSORY: Intact to light touch, pinprick, positional sensation and vibratory sensation are intact in fingers and toes.  COORDINATION: Rapid alternating movements and fine finger movements are intact. There is no dysmetria on finger-to-nose and heel-knee-shin.    GAIT/STANCE: Posture is normal. Gait is steady with normal steps, base, arm swing, and turning. Heel and toe walking are normal. Tandem gait is normal.  Romberg is absent.   DIAGNOSTIC DATA (LABS, IMAGING, TESTING) - I reviewed patient records, labs, notes, testing and imaging myself where available.   ASSESSMENT AND PLAN  Angel Baker is a 47 y.o. male   New onset right side headache  Normal MRI brain, neurological exam  Overall much improved  Laboratory evaluations in January 2019 showed normal negative ESR, C-reactive protein, A1c, TSH, vitamin D, CBC  I have advised him to decrease indomethacin to 50 mg as needed  Maxalt 10 mg as needed for severe headaches  Higher dose of nortriptyline new prescription of 25 mg, 2 tablets every night, also suggested magnesium oxide, riboflavin    Marcial Pacas, M.D.  Ph.D.  [facility] [address] [place], [place] [address] Ph: [phone] Fax: [phone]  CC: Kathyrn Drown, MD

## 2018-02-09 ENCOUNTER — Ambulatory Visit (INDEPENDENT_AMBULATORY_CARE_PROVIDER_SITE_OTHER): Payer: BLUE CROSS/BLUE SHIELD | Admitting: Neurology

## 2018-02-09 ENCOUNTER — Encounter: Payer: Self-pay | Admitting: Neurology

## 2018-02-09 ENCOUNTER — Encounter

## 2018-02-09 VITALS — BP 122/68 | HR 84 | Ht 66.0 in | Wt 161.0 lb

## 2018-02-09 DIAGNOSIS — R51 Headache: Secondary | ICD-10-CM | POA: Diagnosis not present

## 2018-02-09 DIAGNOSIS — G8929 Other chronic pain: Secondary | ICD-10-CM

## 2018-02-09 MED ORDER — NORTRIPTYLINE HCL 25 MG PO CAPS: 50.0000 mg | ORAL_CAPSULE | Freq: Every day | ORAL | 11 refills | Status: DC

## 2018-02-09 MED ORDER — PROPRANOLOL HCL ER 60 MG PO CP24: 60.0000 mg | ORAL_CAPSULE | Freq: Every day | ORAL | 11 refills | Status: DC

## 2018-02-09 NOTE — Progress Notes (Signed)
PATIENT: Angel Baker DOB: 1970/10/14  Chief Complaint  Patient presents with  . Headache    He is here with his wife, Angel Baker.  He is still having some type of headache daily despite taking nortriptyline 75m at bedtime.  He has been taking Indomethacin 575mevery night.  He has not taken Maxalt because his pain is mild to moderate but never severe.     HISTORICAL  RoDyson Baker a [age]ear old male, seen in refer by primary care doctor LuSallee Baker, for evaluation of new onset headaches, initial evaluation was June 03, 2017.  He was previously healthy, deny a history of headache, presented with new onset headaches since May 2018, he contributed to his busy working schedule initially, but is intermittent right frontal retro-orbital area headache become constant over time, he now complains of 3 out of 10 daily headaches, sometimes exacerbated to 6 out of 10, there was no significant light noise sensitivity, no nausea,  He has been taking frequent Aleve, ibuprofen, occasionally Tylenol on a daily basis without helping his headache much, he was given prescription of Topamax 50 mg twice a day since December 2018, he complains of mild mental slowing, numbness tingling, there was no significant improvement of his headaches.  He also tried 1 tablets of Imitrex 100 mg, which did not help his headache,  Wife noticed patient suffer mild depression, he began to run marathon recently Personally reviewed MRI of the brain without contrast in December 2018 that was normal,  Normal ESR, 2, CBC,  UPDATE August 07 2017: He still almost daily headache, mild, right side, often woke up with headaches, he takes nortriptyline 20 mg every night, which did help his sleep some, he still wake up a few times every night, mild snoring, but no choking episode, he takes indomethacin 50 mg twice a day, was able to finish his marathon run  UPDATE Sept 30 2019: Laboratory evaluation January 2019 showed  normal vitamin D 39, ESR, C-reactive protein, TSH, A1c, CMP, CBC,  Personally reviewed MRI of the brain without contrast in September 2018 there was no acute abnormality,  He still run marathon, 4 miles a day, he still has headache right side, around right eye, he is taking indomethacin 5075mhs,   REVIEW OF SYSTEMS: Full 14 system review of systems performed and notable only for frequent urination, headaches  No Known Allergies  HOME MEDICATIONS: Current Outpatient Medications  Medication Sig Dispense Refill  . fluticasone (FLONASE) 50 MCG/ACT nasal spray Place 1 spray into both nostrils daily as needed for allergies or rhinitis.    . iMarland Kitchenuprofen (ADVIL,MOTRIN) 400 MG tablet Take 400 mg by mouth daily as needed.    . indomethacin (INDOCIN) 50 MG capsule Take 1 capsule (50 mg total) by mouth 2 (two) times daily as needed. 60 capsule 5  . loratadine (CLARITIN) 10 MG tablet Take 10 mg by mouth as needed for allergies.    . nMarland Kitchenrtriptyline (PAMELOR) 25 MG capsule Take 2 capsules (50 mg total) by mouth at bedtime. 60 capsule 11  . rizatriptan (MAXALT) 10 MG tablet Take 1 tablet (10 mg total) by mouth as needed for migraine. Repeat in 2 hours if needed.  Max 2 tab/24 hrs or 12 tabs/month 12 tablet 11   No current facility-administered medications for this visit.     PAST MEDICAL HISTORY: Past Medical History:  Diagnosis Date  . Asthma   . Headache   . Seasonal allergies     PAST SURGICAL HISTORY:  Past Surgical History:  Procedure Laterality Date  . KNEE SURGERY Left 2010    FAMILY HISTORY: Family History  Problem Relation Age of Onset  . Hypertension Father   . Stroke Father        Carotid artery disease  . Cancer - Other Father        throat  . Hiatal hernia Mother   . Heart attack Maternal Grandmother   . Emphysema Maternal Grandfather   . Diabetes Paternal Grandmother   . Colon cancer Neg Hx     SOCIAL HISTORY:  Social History   Socioeconomic History  . Marital  status: Married    Spouse name: Not on file  . Number of children: 2  . Years of education: 16  . Highest education level: Bachelor's degree (e.g., BA, AB, BS)  Occupational History  . Occupation: Engineer, materials  Social Needs  . Financial resource strain: Not on file  . Food insecurity:    Worry: Not on file    Inability: Not on file  . Transportation needs:    Medical: Not on file    Non-medical: Not on file  Tobacco Use  . Smoking status: Never Smoker  . Smokeless tobacco: Never Used  Substance and Sexual Activity  . Alcohol use: Yes    Alcohol/week: 1.0 standard drinks    Types: 1 Standard drinks or equivalent per week    Comment: occasionally/beer  . Drug use: No  . Sexual activity: Not on file  Lifestyle  . Physical activity:    Days per week: Not on file    Minutes per session: Not on file  . Stress: Not on file  Relationships  . Social connections:    Talks on phone: Not on file    Gets together: Not on file    Attends religious service: Not on file    Active member of club or organization: Not on file    Attends meetings of clubs or organizations: Not on file    Relationship status: Not on file  . Intimate partner violence:    Fear of current or ex partner: Not on file    Emotionally abused: Not on file    Physically abused: Not on file    Forced sexual activity: Not on file  Other Topics Concern  . Not on file  Social History Narrative   Lives at home with his wife.   Right-handed.   2 cup caffeine per day.     PHYSICAL EXAM   Vitals:   02/09/18 0925  BP: 122/68  Pulse: 84  Weight: 161 lb (73 kg)  Height: '5\' 6"'  (1.676 m)    Not recorded      Body mass index is 25.99 kg/m.  PHYSICAL EXAMNIATION:  Gen: NAD, conversant, well nourised, obese, well groomed                     Cardiovascular: Regular rate rhythm, no peripheral edema, warm, nontender. Eyes: Conjunctivae clear without exudates or hemorrhage Neck: Supple, no  carotid bruits. Pulmonary: Clear to auscultation bilaterally   NEUROLOGICAL EXAM:  MENTAL STATUS: Speech:    Speech is normal; fluent and spontaneous with normal comprehension.  Cognition:     Orientation to time, place and person     Normal recent and remote memory     Normal Attention span and concentration     Normal Language, naming, repeating,spontaneous speech     Fund of knowledge   CRANIAL NERVES: CN  II: Visual fields are full to confrontation. Fundoscopic exam is normal with sharp discs and no vascular changes. Pupils are round equal and briskly reactive to light. CN III, IV, VI: extraocular movement are normal. No ptosis. CN V: Facial sensation is intact to pinprick in all 3 divisions bilaterally. Corneal responses are intact.  CN VII: Face is symmetric with normal eye closure and smile. CN VIII: Hearing is normal to rubbing fingers CN IX, X: Palate elevates symmetrically. Phonation is normal. CN XI: Head turning and shoulder shrug are intact CN XII: Tongue is midline with normal movements and no atrophy.  MOTOR: There is no pronator drift of out-stretched arms. Muscle bulk and tone are normal. Muscle strength is normal.  REFLEXES: Reflexes are 2+ and symmetric at the biceps, triceps, knees, and ankles. Plantar responses are flexor.  SENSORY: Intact to light touch, pinprick, positional sensation and vibratory sensation are intact in fingers and toes.  COORDINATION: Rapid alternating movements and fine finger movements are intact. There is no dysmetria on finger-to-nose and heel-knee-shin.    GAIT/STANCE: Posture is normal. Gait is steady with normal steps, base, arm swing, and turning. Heel and toe walking are normal. Tandem gait is normal.  Romberg is absent.   DIAGNOSTIC DATA (LABS, IMAGING, TESTING) - I reviewed patient records, labs, notes, testing and imaging myself where available.   ASSESSMENT AND PLAN  Amillion Macchia is a 47 y.o. male   Persistent  right side headache  Normal MRI brain, neurological exam  Laboratory evaluations in January 2019 showed normal negative ESR, C-reactive protein, A1c, TSH, vitamin D, CBC  Stop daily indomethacin use  Maxalt 10 mg as needed for severe headaches  Keep nortriptyline new prescription of 25 mg, 2 tablets every night, add on inderal LA 56mdiaily.    YMarcial Pacas M.D. Ph.D.  [facility] [address] [place] [id number]: [phone]: [phone] CC: LKathyrn Drown MD

## 2018-03-30 ENCOUNTER — Other Ambulatory Visit: Payer: Self-pay | Admitting: Neurology

## 2018-08-10 ENCOUNTER — Ambulatory Visit: Payer: BLUE CROSS/BLUE SHIELD | Admitting: Nurse Practitioner

## 2018-08-10 ENCOUNTER — Ambulatory Visit: Payer: BLUE CROSS/BLUE SHIELD | Admitting: Neurology

## 2018-09-26 ENCOUNTER — Other Ambulatory Visit: Payer: Self-pay | Admitting: Family Medicine

## 2018-09-26 ENCOUNTER — Ambulatory Visit (INDEPENDENT_AMBULATORY_CARE_PROVIDER_SITE_OTHER): Payer: BLUE CROSS/BLUE SHIELD | Admitting: Family Medicine

## 2018-09-26 DIAGNOSIS — M79642 Pain in left hand: Secondary | ICD-10-CM | POA: Diagnosis not present

## 2018-09-26 DIAGNOSIS — W540XXA Bitten by dog, initial encounter: Secondary | ICD-10-CM

## 2018-09-26 DIAGNOSIS — S61412A Laceration without foreign body of left hand, initial encounter: Secondary | ICD-10-CM | POA: Diagnosis not present

## 2018-09-26 DIAGNOSIS — Z23 Encounter for immunization: Secondary | ICD-10-CM

## 2018-09-26 MED ORDER — AMOXICILLIN-POT CLAVULANATE 875-125 MG PO TABS: 1.0000 | ORAL_TABLET | Freq: Two times a day (BID) | ORAL | 0 refills | Status: DC

## 2018-09-26 MED ORDER — MUPIROCIN 2 % EX OINT: TOPICAL_OINTMENT | CUTANEOUS | 2 refills | Status: DC

## 2018-09-28 ENCOUNTER — Other Ambulatory Visit: Payer: Self-pay

## 2018-09-29 NOTE — Progress Notes (Signed)
Subjective:     Patient ID: Angel Baker, male   DOB: 03/21/1971, 48 y.o.   MRN: [id number]  HPI This patient sustained a dog bite He states he accidentally ran over his dog The dog was injured and when the patient tried to help the dog, the dog promptly bit him on his left hand sustaining several lacerations.  The dog was not up-to-date on rabies but has been inside predominantly for months and is been acting normal until this accident occurred.  They have other dogs that are all acting normal.  The patient states the dog has not been acting aggressive or abnormal and was very old and could barely get around. The patient states he is able to feel everything in his hand but he has a laceration on the left index finger as well as at the base of the middle finger.  He also has several small lacerations on the back of the thumb in the back of the hand.  He denies lacerations anywhere else.  Patient relates last tetanus shot 2009. Denies being allergic to any medications or lidocaine. Patient states he rinsed out the wound at home with copious amounts of fresh water Review of Systems Other than the having some hand pain and discomfort he denies any other trouble denies arm pain denies numbness tingling weakness denies nausea vomiting passing out    Objective:   Physical Exam No acute distress has lacerations noted on the left hand.  Sensation to monofilament is normal throughout the hand.  Tendon function of the hand is normal.  Strength normal.  Circulation normal.  No sign of any type of complex injury.    Assessment:     Hand laceration see procedure note    Plan:     Hand laceration Sutures placed Recommended removal in approximately 12 days Antibiotics recommended and sent in Wound coverage and warning signs discussed.  Bandage placed. Tetanus shot given.  Procedure note Careful inspection of each wound was made.  The patient has approximately half centimeter cut at the PIP joint  level but it does not go down into the tendons normal sensation around it as well as normal tendon function  He also has a laceration at the base of the third finger that is approximately 2 cm in length has a jagged cut appearance it does not go down into the tendons or the capsule.  It is in the fat pad of the hand.  It does not need stitches.  Bleeding controlled.  Normal sensation around it.  Normal tendon strength function  Both areas being due to a dog bite have a more jagged appearance to the skin therefore suturing will not necessarily get close approximation and will leave scars on both patient is aware.  Also I told the patient that we typically do not put an abundance of stitches with these because of the risk of infection.  Both areas were numbed with lidocaine without difficulty.  Both areas were irrigated with copious amounts of fresh tap water.  Upon assuring that the areas were numb and some additional numbing medicine-lidocaine without epinephrine- 3 sutures were placed at the index finger at the DIP palmar region. 4 sutures were placed in the palm of the hand where the laceration at the base of the third finger was.  Bleeding was controlled no signs of any complication no sign of any foreign body  Tetanus shot was given Bandage was placed Above instructions given  Animal control was called regarding this  case.  I did discuss it with animal control deputy who is on for that Saturday.  Given that the dog was normal behavior was essentially housebound and did not show any signs of aggression until the dog was injured-they did not feel further testing was necessary on this dog.  The dog was euthanized by [facility].  I did discuss with the patient that the likelihood of rabies was low but he could still do the rabies vaccines if he was interested.  He discussed this with his wife who is a Engineer, civil (consulting) and they decided given the low risk they did not want to go through that  procedure.  He was instructed should he have any signs of any health issues or problems to notify us.

## 2018-10-07 ENCOUNTER — Encounter: Payer: Self-pay | Admitting: Family Medicine

## 2018-10-07 ENCOUNTER — Other Ambulatory Visit: Payer: Self-pay

## 2018-10-07 ENCOUNTER — Ambulatory Visit (INDEPENDENT_AMBULATORY_CARE_PROVIDER_SITE_OTHER): Payer: BLUE CROSS/BLUE SHIELD | Admitting: Family Medicine

## 2018-10-07 VITALS — BP 130/86 | Temp 97.2°F | Wt 165.4 lb

## 2018-10-07 DIAGNOSIS — Z4802 Encounter for removal of sutures: Secondary | ICD-10-CM

## 2018-10-07 NOTE — Progress Notes (Signed)
   Subjective:    Patient ID: Angel Baker, male    DOB: [birth year], 48 y.o.   MRN: [id number]  Suture / Staple Removal  There has been no drainage from the wound. There is no redness present. There is no swelling present. There is no pain present.   Pt here today for suture removal in left hand.  The patient sustained a cut to his hand causing significant trouble at the index finger as well as the underneath the third finger on the left hand.  He did have some infection from the dog bite on the back of the hand but the antibiotics along with peroxide took care of it he is here today for suture removal  Review of Systems    He does relate a little bit of tingling at the end of the index finger but nothing severe he has full range of motion Objective:   Physical Exam Full range of motion of the hands a separate cannot bend the index finger at the PIP joint 100% because of the swelling in the hand but otherwise he has normal function normal strength  Sutures were removed without difficulty no foreign body detected    He was encouraged to do some range of motion exercises regular basis until getting back to full range of motion     Assessment & Plan:  Patient is take care of his hand avoid any type of torquing or any type of major stress to it should do well if he has any problem her let us know

## 2019-01-04 ENCOUNTER — Other Ambulatory Visit: Payer: Self-pay | Admitting: Neurology

## 2019-05-04 ENCOUNTER — Other Ambulatory Visit: Payer: Self-pay | Admitting: Neurology

## 2019-07-20 ENCOUNTER — Encounter: Payer: Self-pay | Admitting: Family Medicine

## 2019-07-20 ENCOUNTER — Ambulatory Visit (HOSPITAL_COMMUNITY)
Admission: RE | Admit: 2019-07-20 | Discharge: 2019-07-20 | Disposition: A | Discharge status: Home / Self Care | Payer: BC Managed Care – PPO | Source: Ambulatory Visit | Attending: Family Medicine | Admitting: Family Medicine

## 2019-07-20 ENCOUNTER — Ambulatory Visit (INDEPENDENT_AMBULATORY_CARE_PROVIDER_SITE_OTHER): Payer: BC Managed Care – PPO | Admitting: Family Medicine

## 2019-07-20 ENCOUNTER — Other Ambulatory Visit: Payer: Self-pay

## 2019-07-20 VITALS — BP 122/80 | Temp 98.2°F | Ht 65.5 in | Wt 160.2 lb

## 2019-07-20 DIAGNOSIS — H9201 Otalgia, right ear: Secondary | ICD-10-CM

## 2019-07-20 DIAGNOSIS — Z131 Encounter for screening for diabetes mellitus: Secondary | ICD-10-CM

## 2019-07-20 DIAGNOSIS — Z13220 Encounter for screening for lipoid disorders: Secondary | ICD-10-CM | POA: Diagnosis not present

## 2019-07-20 DIAGNOSIS — R519 Headache, unspecified: Secondary | ICD-10-CM | POA: Diagnosis not present

## 2019-07-20 DIAGNOSIS — M542 Cervicalgia: Secondary | ICD-10-CM

## 2019-07-20 DIAGNOSIS — Z1211 Encounter for screening for malignant neoplasm of colon: Secondary | ICD-10-CM

## 2019-07-20 DIAGNOSIS — R5383 Other fatigue: Secondary | ICD-10-CM | POA: Diagnosis not present

## 2019-07-20 DIAGNOSIS — G8929 Other chronic pain: Secondary | ICD-10-CM

## 2019-07-20 DIAGNOSIS — Z125 Encounter for screening for malignant neoplasm of prostate: Secondary | ICD-10-CM

## 2019-07-20 NOTE — Progress Notes (Addendum)
Subjective:    Patient ID: Angel Baker, male    DOB: 02/24/1971, 49 y.o.   MRN: [id number]  HPI Very nice patient here today for wellness exam The patient comes in today for a wellness visit.    A review of their health history was completed.  A review of medications was also completed.  Any needed refills; no  Eating habits: not as good as should be  Falls/  MVA accidents in past few months: none  Regular exercise: runs 4 days a week  Specialist pt sees on regular basis: none  Preventative health issues were discussed.   Additional concerns: general fatigue and lack of motivation   Review of Systems  Constitutional: Positive for fatigue. Negative for activity change, appetite change and fever.  HENT: Negative for congestion and rhinorrhea.   Eyes: Negative for discharge.  Respiratory: Negative for cough and wheezing.   Cardiovascular: Negative for chest pain.  Gastrointestinal: Negative for abdominal pain, blood in stool and vomiting.  Genitourinary: Negative for difficulty urinating and frequency.  Musculoskeletal: Negative for neck pain.  Skin: Negative for rash.  Allergic/Immunologic: Negative for environmental allergies and food allergies.  Neurological: Negative for weakness and headaches.  Psychiatric/Behavioral: Negative for agitation.       Objective:   Physical Exam Constitutional:      Appearance: He is well-developed.  HENT:     Head: Normocephalic and atraumatic.     Right Ear: External ear normal.     Left Ear: External ear normal.     Nose: Nose normal.  Eyes:     Pupils: Pupils are equal, round, and reactive to light.  Neck:     Thyroid: No thyromegaly.  Cardiovascular:     Rate and Rhythm: Normal rate and regular rhythm.     Heart sounds: Normal heart sounds. No murmur.  Pulmonary:     Effort: Pulmonary effort is normal. No respiratory distress.     Breath sounds: Normal breath sounds. No wheezing.  Abdominal:     General: Bowel  sounds are normal. There is no distension.     Palpations: Abdomen is soft. There is no mass.     Tenderness: There is no abdominal tenderness.  Genitourinary:    Penis: Normal.   Musculoskeletal:        General: Normal range of motion.     Cervical back: Normal range of motion and neck supple.  Lymphadenopathy:     Cervical: No cervical adenopathy.  Skin:    General: Skin is warm and dry.     Findings: No erythema.  Neurological:     Mental Status: He is alert.     Motor: No abnormal muscle tone.  Psychiatric:        Behavior: Behavior normal.        Judgment: Judgment normal.    Sleep  fatigu not suree 3 GAD 7 : Generalized Anxiety Score 07/20/2019  Nervous, Anxious, on Edge 1  Control/stop worrying 1  Worry too much - different things 1  Trouble relaxing 1  Restless 0  Easily annoyed or irritable 0  Afraid - awful might happen 1  Total GAD 7 Score 5  Anxiety Difficulty Somewhat difficult      head her mouth isaches PHQ9 SCORE ONLY 07/20/2019  Score 8       Assessment & Plan:  ENT-if his ongoing pressure feeling in his ear will set him up with ENT the patient is concerned that the fullness he feels in the  back of his neck in his right ear could be something bad.  He worries about the possibility of a tumor does not know if he needs an MRI or not I believe it would be best for the patient to be seen by ENT  Patient is concerned that at times he has lack of interest other times he finds himself feeling lack of enjoyment but denies being depressed.  He is open to the possibility of medication.  We will discuss this more after he gets his labs  Should be noted that his lab work came back looking really good cholesterol slightly elevated but otherwise everything looks well we had a long discussion regarding this patient does consent to going on sertraline.  He is interested in trying it.  He was educated regarding side effects.  He will do a virtual follow-up in 4 to 6 weeks  and send [place] a MyChart update in 2 weeks he will utilize sertraline 50 mg 1/2 tablet each day for 7 days then a full tablet thereafter  Adult wellness-complete.wellness physical was conducted today. Importance of diet and exercise were discussed in detail.  In addition to this a discussion regarding safety was also covered. We also reviewed over immunizations and gave recommendations regarding current immunization needed for age.  In addition to this additional areas were also touched on including: Preventative health exams needed:  Colonoscopy colonoscopy recommended referral put in place  Patient was advised yearly wellness exam  1. Chronic nonintractable headache, unspecified headache type In regards to these headaches I do not feel he needs to have an MRI currently.  He had 05/2016 instruct numerous medications in what is going to neurology but no longer going to neurology  2. Other fatigue Significant fatigue check thyroid CBC also check testosterone - CBC with Differential/Platelet - TSH - Hepatic function panel - VITAMIN D 25 Hydroxy (Vit-D Deficiency, Fractures) - Testosterone  3. Neck pain Because of right posterior neck pain will go ahead and do cervical spine series await the results I doubt tumor. - DG Cervical Spine Complete  4. Screening, lipid Screening cholesterol recommended - Lipid panel  5. Screening PSA (prostate specific antigen) Screening PSA recommended - PSA  6. Screening for malignant neoplasm of colon Screening colonoscopy recommended - Ambulatory referral to Gastroenterology  7. Encounter for screening examination for impaired glucose regulation and diabetes mellitus We will check glucose await the results - Basic metabolic panel  Patient does inquire whether or not he may need medication to help him with his moods I do not feel he is depressed but I do think he has some degree of lack of enjoyment and inertia as well as some worry symptoms may  benefit from medication but I would like to see the lab work first

## 2019-07-21 ENCOUNTER — Encounter: Payer: Self-pay | Admitting: Family Medicine

## 2019-07-21 LAB — CBC WITH DIFFERENTIAL/PLATELET
Basophils Absolute: 0.1 10*3/uL (ref 0.0–0.2)
Basos: 1 %
EOS (ABSOLUTE): 0 10*3/uL (ref 0.0–0.4)
Eos: 1 %
Hematocrit: 50.4 % (ref 37.5–51.0)
Hemoglobin: 17.2 g/dL (ref 13.0–17.7)
Immature Grans (Abs): 0 10*3/uL (ref 0.0–0.1)
Immature Granulocytes: 0 %
Lymphocytes Absolute: 1.3 10*3/uL (ref 0.7–3.1)
Lymphs: 29 %
MCH: 30.6 pg (ref 26.6–33.0)
MCHC: 34.1 g/dL (ref 31.5–35.7)
MCV: 90 fL (ref 79–97)
Monocytes Absolute: 0.4 10*3/uL (ref 0.1–0.9)
Monocytes: 8 %
Neutrophils Absolute: 2.9 10*3/uL (ref 1.4–7.0)
Neutrophils: 61 %
Platelets: 170 10*3/uL (ref 150–450)
RBC: 5.63 x10E6/uL (ref 4.14–5.80)
RDW: 12.5 % (ref 11.6–15.4)
WBC: 4.7 10*3/uL (ref 3.4–10.8)

## 2019-07-21 LAB — LIPID PANEL
Chol/HDL Ratio: 3.4 ratio (ref 0.0–5.0)
Cholesterol, Total: 208 mg/dL — ABNORMAL HIGH (ref 100–199)
HDL: 62 mg/dL (ref >39)
LDL Chol Calc (NIH): 134 mg/dL — ABNORMAL HIGH (ref 0–99)
Triglycerides: 66 mg/dL (ref 0–149)
VLDL Cholesterol Cal: 12 mg/dL (ref 5–40)

## 2019-07-21 LAB — BASIC METABOLIC PANEL
BUN/Creatinine Ratio: 12 (ref 9–20)
BUN: 12 mg/dL (ref 6–24)
CO2: 25 mmol/L (ref 20–29)
Calcium: 9.9 mg/dL (ref 8.7–10.2)
Chloride: 102 mmol/L (ref 96–106)
Creatinine, Ser: 1.01 mg/dL (ref 0.76–1.27)
GFR calc Af Amer: 100 mL/min/{1.73_m2} (ref >59)
GFR calc non Af Amer: 86 mL/min/{1.73_m2} (ref >59)
Glucose: 98 mg/dL (ref 65–99)
Potassium: 4.8 mmol/L (ref 3.5–5.2)
Sodium: 142 mmol/L (ref 134–144)

## 2019-07-21 LAB — VITAMIN D 25 HYDROXY (VIT D DEFICIENCY, FRACTURES): Vit D, 25-Hydroxy: 46.2 ng/mL (ref 30.0–100.0)

## 2019-07-21 LAB — HEPATIC FUNCTION PANEL
ALT: 19 IU/L (ref 0–44)
AST: 20 IU/L (ref 0–40)
Albumin: 5.1 g/dL — ABNORMAL HIGH (ref 4.0–5.0)
Alkaline Phosphatase: 62 IU/L (ref 39–117)
Bilirubin Total: 0.8 mg/dL (ref 0.0–1.2)
Bilirubin, Direct: 0.19 mg/dL (ref 0.00–0.40)
Total Protein: 7.3 g/dL (ref 6.0–8.5)

## 2019-07-21 LAB — TSH: TSH: 1.74 u[IU]/mL (ref 0.450–4.500)

## 2019-07-21 LAB — PSA: Prostate Specific Ag, Serum: 0.5 ng/mL (ref 0.0–4.0)

## 2019-07-21 LAB — TESTOSTERONE: Testosterone: 734 ng/dL (ref 264–916)

## 2019-07-21 MED ORDER — SERTRALINE HCL 50 MG PO TABS: 50.0000 mg | ORAL_TABLET | Freq: Every day | ORAL | 3 refills | Status: DC

## 2019-07-21 NOTE — Addendum Note (Signed)
Addended by: Lilyan Punt A on: 07/21/2019 09:06 PM   Modules accepted: Orders

## 2019-07-22 NOTE — Addendum Note (Signed)
Addended by: Marlowe Shores on: 07/22/2019 01:32 PM   Modules accepted: Orders

## 2019-07-22 NOTE — Progress Notes (Signed)
ENT referral placed.

## 2019-07-26 ENCOUNTER — Encounter: Payer: Self-pay | Admitting: Internal Medicine

## 2019-07-26 ENCOUNTER — Encounter: Payer: Self-pay | Admitting: Family Medicine

## 2019-07-28 ENCOUNTER — Encounter: Payer: Self-pay | Admitting: Family Medicine

## 2019-08-15 ENCOUNTER — Other Ambulatory Visit: Payer: Self-pay | Admitting: Family Medicine

## 2019-08-16 NOTE — Telephone Encounter (Signed)
May have 90-day with 1 refill follow-up in the summer

## 2019-08-18 ENCOUNTER — Encounter: Payer: Self-pay | Admitting: Family Medicine

## 2019-08-25 ENCOUNTER — Other Ambulatory Visit: Payer: Self-pay

## 2019-08-25 ENCOUNTER — Ambulatory Visit (AMBULATORY_SURGERY_CENTER): Payer: Self-pay

## 2019-08-25 VITALS — Temp 97.8°F | Ht 65.5 in | Wt 163.0 lb

## 2019-08-25 DIAGNOSIS — Z1211 Encounter for screening for malignant neoplasm of colon: Secondary | ICD-10-CM

## 2019-08-25 NOTE — Progress Notes (Signed)
No allergies to soy or egg Pt is not on blood thinners or diet pills Denies issues with sedation/intubation Denies atrial flutter/fib Denies constipation   Emmi instructions given to pt  Pt is aware of Covid safety and care partner requirements.  

## 2019-09-03 ENCOUNTER — Other Ambulatory Visit (HOSPITAL_COMMUNITY): Admission: RE | Admit: 2019-09-03 | Payer: BC Managed Care – PPO | Source: Ambulatory Visit

## 2019-09-03 ENCOUNTER — Other Ambulatory Visit (HOSPITAL_COMMUNITY)
Admission: RE | Admit: 2019-09-03 | Discharge: 2019-09-03 | Disposition: A | Discharge status: Home / Self Care | Payer: BC Managed Care – PPO | Source: Ambulatory Visit | Attending: Internal Medicine | Admitting: Internal Medicine

## 2019-09-03 ENCOUNTER — Other Ambulatory Visit: Payer: Self-pay

## 2019-09-03 DIAGNOSIS — Z01812 Encounter for preprocedural laboratory examination: Secondary | ICD-10-CM | POA: Insufficient documentation

## 2019-09-03 DIAGNOSIS — Z20822 Contact with and (suspected) exposure to covid-19: Secondary | ICD-10-CM | POA: Insufficient documentation

## 2019-09-04 LAB — SARS CORONAVIRUS 2 (TAT 6-24 HRS): SARS Coronavirus 2: NEGATIVE

## 2019-09-06 ENCOUNTER — Encounter: Payer: Self-pay | Admitting: Internal Medicine

## 2019-09-08 ENCOUNTER — Encounter: Payer: Self-pay | Admitting: Internal Medicine

## 2019-09-08 ENCOUNTER — Ambulatory Visit (AMBULATORY_SURGERY_CENTER): Payer: BC Managed Care – PPO | Admitting: Internal Medicine

## 2019-09-08 ENCOUNTER — Other Ambulatory Visit: Payer: Self-pay

## 2019-09-08 VITALS — BP 111/60 | HR 50 | Temp 96.6°F | Resp 15 | Ht 66.0 in | Wt 163.0 lb

## 2019-09-08 DIAGNOSIS — Z1211 Encounter for screening for malignant neoplasm of colon: Secondary | ICD-10-CM

## 2019-09-08 MED ORDER — SODIUM CHLORIDE 0.9 % IV SOLN: 500.0000 mL | Freq: Once | INTRAVENOUS | Status: DC

## 2019-09-08 NOTE — Op Note (Signed)
[place] Patient Name: Angel Baker Procedure Date: 09/08/2019 8:38 AM MRN: [id number] Endoscopist: Gatha Mayer , MD Age: 49 Referring MD:  Date of Birth: 02-15-1971 Gender: Male Account #: [id number] Procedure:                Colonoscopy Indications:              Screening for colorectal malignant neoplasm Medicines:                Propofol per Anesthesia, Monitored Anesthesia Care Procedure:                Pre-Anesthesia Assessment:                           - Prior to the procedure, a History and Physical                            was performed, and patient medications and                            allergies were reviewed. The patient's tolerance of                            previous anesthesia was also reviewed. The risks                            and benefits of the procedure and the sedation                            options and risks were discussed with the patient.                            All questions were answered, and informed consent                            was obtained. Prior Anticoagulants: The patient has                            taken no previous anticoagulant or antiplatelet                            agents. ASA Grade Assessment: II - A patient with                            mild systemic disease. After reviewing the risks                            and benefits, the patient was deemed in                            satisfactory condition to undergo the procedure.                           After obtaining informed consent, the colonoscope  was passed under direct vision. Throughout the                            procedure, the patient's blood pressure, pulse, and                            oxygen saturations were monitored continuously. The                            Colonoscope was introduced through the anus and                            advanced to the the cecum, identified by   appendiceal orifice and ileocecal valve. The                            ileocecal valve, appendiceal orifice, and rectum                            were photographed. The quality of the bowel                            preparation was good. The bowel preparation used                            was Miralax via split dose instruction. Scope In: 8:52:00 AM Scope Out: 9:08:05 AM Scope Withdrawal Time: 0 hours 13 minutes 1 second  Total Procedure Duration: 0 hours 16 minutes 5 seconds  Findings:                 The perianal and digital rectal examinations were                            normal. Pertinent negatives include normal prostate                            (size, shape, and consistency).                           Multiple diverticula were found in the sigmoid                            colon.                           The exam was otherwise without abnormality on                            direct and retroflexion views. Complications:            No immediate complications. Estimated blood loss:                            None. Estimated Blood Loss:     Estimated blood loss: none. Impression:               - Mild diverticulosis  in the sigmoid colon.                           - The examination was otherwise normal on direct                            and retroflexion views.                           - No specimens collected. Recommendation:           - Repeat colonoscopy in 10 years for screening                            purposes.                           - Resume previous diet.                           - Continue present medications. Iva Boop, MD 09/08/2019 9:12:59 AM This report has been signed electronically.

## 2019-09-08 NOTE — Progress Notes (Signed)
A/ox3, pleased with MAC, report to RN 

## 2019-09-08 NOTE — Progress Notes (Signed)
Pt's states no medical or surgical changes since previsit or office visit.  Temp- June Vitals- Donna 

## 2019-09-08 NOTE — Patient Instructions (Addendum)
No polyps or cancer seen.  You do have diverticulosis - thickened muscle rings and pouches in the colon wall. Please read the handout about this condition.  Next routine colonoscopy or other screening test in 10 years - 2031  I appreciate the opportunity to care for you. Iva Boop, MD, FACG   YOU HAD AN ENDOSCOPIC PROCEDURE TODAY AT THE [facility]:   Refer to the procedure report that was given to you for any specific questions about what was found during the examination.  If the procedure report does not answer your questions, please call your gastroenterologist to clarify.  If you requested that your care partner not be given the details of your procedure findings, then the procedure report has been included in a sealed envelope for you to review at your convenience later.  YOU SHOULD EXPECT: Some feelings of bloating in the abdomen. Passage of more gas than usual.  Walking can help get rid of the air that was put into your GI tract during the procedure and reduce the bloating. If you had a lower endoscopy (such as a colonoscopy or flexible sigmoidoscopy) you may notice spotting of blood in your stool or on the toilet paper. If you underwent a bowel prep for your procedure, you may not have a normal bowel movement for a few days.  Please Note:  You might notice some irritation and congestion in your nose or some drainage.  This is from the oxygen used during your procedure.  There is no need for concern and it should clear up in a day or so.  SYMPTOMS TO REPORT IMMEDIATELY:   Following lower endoscopy (colonoscopy or flexible sigmoidoscopy):  Excessive amounts of blood in the stool  Significant tenderness or worsening of abdominal pains  Swelling of the abdomen that is new, acute  Fever of 100F or higher   For urgent or emergent issues, a gastroenterologist can be reached at any hour by calling [phone]. Do not use MyChart messaging for urgent concerns.     DIET:  We do recommend a small meal at first, but then you may proceed to your regular diet.  Drink plenty of fluids but you should avoid alcoholic beverages for 24 hours.  ACTIVITY:  You should plan to take it easy for the rest of today and you should NOT DRIVE or use heavy machinery until tomorrow (because of the sedation medicines used during the test).    FOLLOW UP: Our staff will call the number listed on your records 48-72 hours following your procedure to check on you and address any questions or concerns that you may have regarding the information given to you following your procedure. If we do not reach you, we will leave a message.  We will attempt to reach you two times.  During this call, we will ask if you have developed any symptoms of COVID 19. If you develop any symptoms (ie: fever, flu-like symptoms, shortness of breath, cough etc.) before then, please call [phone].  If you test positive for Covid 19 in the 2 weeks post procedure, please call and report this information to [place].    If any biopsies were taken you will be contacted by phone or by letter within the next 1-3 weeks.  Please call us at [phone] if you have not heard about the biopsies in 3 weeks.    SIGNATURES/CONFIDENTIALITY: You and/or your care partner have signed paperwork which will be entered into your electronic medical record.  These signatures attest to the fact that that the information above on your After Visit Summary has been reviewed and is understood.  Full responsibility of the confidentiality of this discharge information lies with you and/or your care-partner.

## 2019-09-10 ENCOUNTER — Telehealth: Payer: Self-pay | Admitting: *Deleted

## 2019-09-10 NOTE — Telephone Encounter (Signed)
  Follow up Call-  Call back number 09/08/2019  Post procedure Call Back phone  # [phone]  Permission to leave phone message Yes  Some recent data might be hidden     Patient questions:  Do you have a fever, pain , or abdominal swelling? No. Pain Score  0 *  Have you tolerated food without any problems? Yes.    Have you been able to return to your normal activities? Yes.    Do you have any questions about your discharge instructions: Diet   No. Medications  No. Follow up visit  No.  Do you have questions or concerns about your Care? No.  Actions: * If pain score is 4 or above: No action needed, pain <4.  1. Have you developed a fever since your procedure? no  2.   Have you had an respiratory symptoms (SOB or cough) since your procedure? no  3.   Have you tested positive for COVID 19 since your procedure no  4.   Have you had any family members/close contacts diagnosed with the COVID 19 since your procedure?  no   If yes to any of these questions please route to Laverna Peace, RN and Charlett Lango, RN

## 2020-02-25 ENCOUNTER — Other Ambulatory Visit: Payer: Self-pay | Admitting: Family Medicine

## 2020-02-25 NOTE — Telephone Encounter (Signed)
07/20/19 was last visit and it was a wellness

## 2020-02-27 ENCOUNTER — Encounter: Payer: Self-pay | Admitting: Family Medicine

## 2020-03-09 ENCOUNTER — Other Ambulatory Visit: Payer: Self-pay

## 2020-03-09 ENCOUNTER — Encounter: Payer: Self-pay | Admitting: Family Medicine

## 2020-03-09 ENCOUNTER — Ambulatory Visit (INDEPENDENT_AMBULATORY_CARE_PROVIDER_SITE_OTHER): Payer: BC Managed Care – PPO | Admitting: Family Medicine

## 2020-03-09 VITALS — BP 116/88 | HR 76 | Temp 97.5°F | Wt 167.0 lb

## 2020-03-09 DIAGNOSIS — M5432 Sciatica, left side: Secondary | ICD-10-CM

## 2020-03-09 NOTE — Progress Notes (Signed)
   Subjective:    Patient ID: Angel Baker, male    DOB: Mar 22, 1971, 49 y.o.   MRN: [id number]  HPI Patient reports low back pain that radiates down left leg and numbness in right toes. This has been occurring off and on for over 1 year but seems to be very often in the last couple of months.  Patient does do a lot of running Has a very active job Does a lot of physical activity He is tried stretching anti-inflammatories This is been going on for at least a year to some degree progressively worse.  Review of Systems No loss of bowel or bladder control.    Objective:   Physical Exam Lungs clear respiratory rate normal heart regular no murmurs Strength in legs is good Increase discomfort in the back radiating down the left leg.       Assessment & Plan:  Lumbar pain with sciatica down the left leg.  This been going on for several years but worse over the past year Referral to [facility] because of this They can help decide whether or not to do nerve injections, physical therapy, MRI

## 2020-03-17 ENCOUNTER — Telehealth: Payer: Self-pay

## 2020-03-17 ENCOUNTER — Encounter: Payer: Self-pay | Admitting: Family Medicine

## 2020-03-17 NOTE — Telephone Encounter (Signed)
Lora at [facility] left a message on the referral line that records were faxed today that said they were for [facility].  She went ahead and shredded everything but wanted [place] to know so we could resend info to [facility].

## 2020-03-29 ENCOUNTER — Ambulatory Visit: Payer: BC Managed Care – PPO | Admitting: Family Medicine

## 2020-06-03 ENCOUNTER — Other Ambulatory Visit: Payer: Self-pay | Admitting: Family Medicine

## 2020-06-05 MED ORDER — SERTRALINE HCL 50 MG PO TABS: 50.0000 mg | ORAL_TABLET | Freq: Every day | ORAL | 0 refills | Status: DC

## 2020-06-05 NOTE — Addendum Note (Signed)
Addended by: Margaretha Sheffield on: 06/05/2020 03:07 PM   Modules accepted: Orders

## 2020-09-12 ENCOUNTER — Other Ambulatory Visit: Payer: Self-pay | Admitting: Family Medicine

## 2020-09-12 NOTE — Telephone Encounter (Signed)
May have 90-day refill needs follow-up visit this summer

## 2020-09-13 NOTE — Telephone Encounter (Signed)
Sent mychart message

## 2020-12-12 ENCOUNTER — Other Ambulatory Visit: Payer: Self-pay | Admitting: Family Medicine

## 2020-12-28 ENCOUNTER — Other Ambulatory Visit: Payer: Self-pay | Admitting: Family Medicine

## 2021-02-21 ENCOUNTER — Other Ambulatory Visit: Payer: Self-pay | Admitting: Family Medicine

## 2021-02-21 NOTE — Telephone Encounter (Signed)
Sent my chart message 02/21/21 

## 2021-04-02 NOTE — Telephone Encounter (Signed)
Second request to schedule appt. 04/02/21

## 2021-05-01 NOTE — Telephone Encounter (Signed)
No response

## 2021-10-29 ENCOUNTER — Encounter: Payer: Self-pay | Admitting: Family Medicine

## 2021-10-31 ENCOUNTER — Ambulatory Visit (INDEPENDENT_AMBULATORY_CARE_PROVIDER_SITE_OTHER): Payer: BC Managed Care – PPO | Admitting: Family Medicine

## 2021-10-31 ENCOUNTER — Ambulatory Visit (HOSPITAL_COMMUNITY)
Admission: RE | Admit: 2021-10-31 | Discharge: 2021-10-31 | Disposition: A | Discharge status: Home / Self Care | Payer: BC Managed Care – PPO | Source: Ambulatory Visit | Attending: Family Medicine | Admitting: Family Medicine

## 2021-10-31 VITALS — BP 110/79 | HR 68 | Temp 97.5°F | Ht 66.0 in | Wt 161.0 lb

## 2021-10-31 DIAGNOSIS — R109 Unspecified abdominal pain: Secondary | ICD-10-CM | POA: Insufficient documentation

## 2021-10-31 NOTE — Assessment & Plan Note (Signed)
Ongoing flank pain radiating to the abdomen.  Uncertain etiology at this time.  Concern for nephrolithiasis.  CT for further evaluation.

## 2021-10-31 NOTE — Progress Notes (Signed)
Subjective:  Patient ID: Angel Baker, male    DOB: 1970/08/15  Age: 51 y.o. MRN: [id number]  CC: Chief Complaint  Patient presents with   right mid back pain    That radiates to RLQ abd pain x couple of weeks - unknown cause    HPI:  51 year old male presents for evaluation of the above.  Patient reports right low back/right flank pain for the past 2 weeks.  He states that it radiates to the front of the abdomen.  He states that the pain is 3-4 out of 10 in severity.  He states that he does not notice it as much during the day when he is active but then it becomes more noticeable at night.  Denies nausea, vomiting, diarrhea.  No constipation.  Denies urinary symptoms.  No fever.  No history of nephrolithiasis.  He is concerned given the persistence and lack of improvement.  He states that he does not feel that this is musculoskeletal in origin.  No other associated symptoms.  No other complaints.  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   Right-handed.   2 cup caffeine per day.   Social Determinants of Health   Financial Resource Strain:  Not on file  Food Insecurity: Not on file  Transportation Needs: Not on file  Physical Activity: Not on file  Stress: Not on file  Social Connections: Not on file    Review of Systems Per HPI  Objective:  BP 110/79   Pulse 68   Temp (!) 97.5 F (36.4 C)   Ht 5\' 6"  (1.676 m)   Wt 161 lb (73 kg)   SpO2 97%   BMI 25.99 kg/m      10/31/2021    8:19 AM 03/09/2020   11:13 AM 09/08/2019    9:23 AM  BP/Weight  Systolic BP 110 116 111  Diastolic BP 79 88 60  Wt. (Lbs) 161 167   BMI 25.99 kg/m2 26.95 kg/m2     Physical Exam Vitals and nursing note reviewed.  Constitutional:      General: He is not in acute distress.    Appearance: Normal appearance.  HENT:     Head: Normocephalic and atraumatic.  Cardiovascular:     Rate and Rhythm: Normal rate and regular rhythm.  Pulmonary:     Effort: Pulmonary effort is normal.     Breath sounds: Normal breath sounds. No wheezing, rhonchi or rales.  Abdominal:     General: There is no distension.  Palpations: Abdomen is soft.     Tenderness: There is no abdominal tenderness.     Comments: No CVA tenderness.  Neurological:     Mental Status: He is alert.  Psychiatric:        Mood and Affect: Mood normal.        Behavior: Behavior normal.     Lab Results  Component Value Date   WBC 4.7 07/20/2019   HGB 17.2 07/20/2019   HCT 50.4 07/20/2019   PLT 170 07/20/2019   GLUCOSE 98 07/20/2019   CHOL 208 (H) 07/20/2019   TRIG 66 07/20/2019   HDL 62 07/20/2019   LDLCALC 134 (H) 07/20/2019   ALT 19 07/20/2019   AST 20 07/20/2019   NA 142 07/20/2019   K 4.8 07/20/2019   CL 102 07/20/2019   CREATININE 1.01 07/20/2019   BUN 12 07/20/2019   CO2 25 07/20/2019   TSH 1.740 07/20/2019   HGBA1C 5.3 06/03/2017     Assessment & Plan:   Problem List Items Addressed This Visit       Other   Flank pain - Primary    Ongoing flank pain radiating to the abdomen.  Uncertain etiology at this time.  Concern for nephrolithiasis.  CT  for further evaluation.       Relevant Orders   CT Abdomen Pelvis Wo Contrast   Lisbeth Puller Adriana Simas DO [facility]

## 2021-11-01 ENCOUNTER — Other Ambulatory Visit: Payer: Self-pay | Admitting: Family Medicine

## 2021-11-01 LAB — CBC
Hematocrit: 47.1 % (ref 37.5–51.0)
Hemoglobin: 15.8 g/dL (ref 13.0–17.7)
MCH: 29.5 pg (ref 26.6–33.0)
MCHC: 33.5 g/dL (ref 31.5–35.7)
MCV: 88 fL (ref 79–97)
Platelets: 198 10*3/uL (ref 150–450)
RBC: 5.36 x10E6/uL (ref 4.14–5.80)
RDW: 12.3 % (ref 11.6–15.4)
WBC: 4.6 10*3/uL (ref 3.4–10.8)

## 2021-11-01 LAB — CMP14+EGFR
ALT: 18 IU/L (ref 0–44)
AST: 15 IU/L (ref 0–40)
Albumin/Globulin Ratio: 2 (ref 1.2–2.2)
Albumin: 4.5 g/dL (ref 3.8–4.9)
Alkaline Phosphatase: 60 IU/L (ref 44–121)
BUN/Creatinine Ratio: 12 (ref 9–20)
BUN: 11 mg/dL (ref 6–24)
Bilirubin Total: 0.5 mg/dL (ref 0.0–1.2)
CO2: 24 mmol/L (ref 20–29)
Calcium: 9.4 mg/dL (ref 8.7–10.2)
Chloride: 104 mmol/L (ref 96–106)
Creatinine, Ser: 0.9 mg/dL (ref 0.76–1.27)
Globulin, Total: 2.2 g/dL (ref 1.5–4.5)
Glucose: 101 mg/dL — ABNORMAL HIGH (ref 70–99)
Potassium: 4.4 mmol/L (ref 3.5–5.2)
Sodium: 143 mmol/L (ref 134–144)
Total Protein: 6.7 g/dL (ref 6.0–8.5)
eGFR: 103 mL/min/{1.73_m2} (ref >59)

## 2021-11-01 MED ORDER — AMOXICILLIN-POT CLAVULANATE 875-125 MG PO TABS: 1.0000 | ORAL_TABLET | Freq: Two times a day (BID) | ORAL | 0 refills | Status: DC

## 2022-12-18 IMAGING — CT CT ABD-PELV W/O CM
2 of 4 series · 16 of 46 positions shown, 18 images · non-contrast
Comparison: CT January 04, 2015

CLINICAL DATA: Two weeks of right-sided flank pain concern for
renal stones.



[Series 3: axial st · axial · 0.73mm/px · z∈[-410,+35]mm · 13 of 99 slices shown, 15 images]
[im 5/99  soft-tissue]
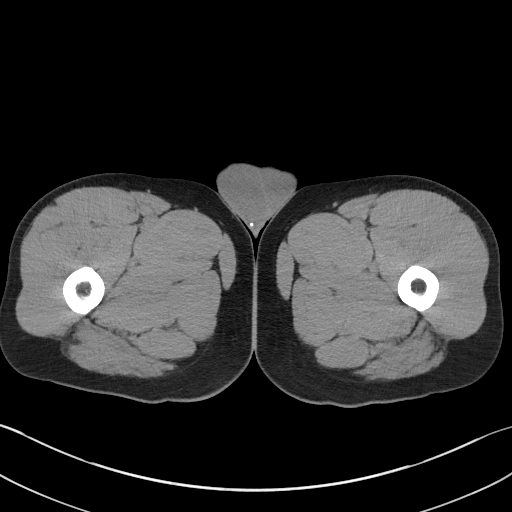
[im 5/99  bone]
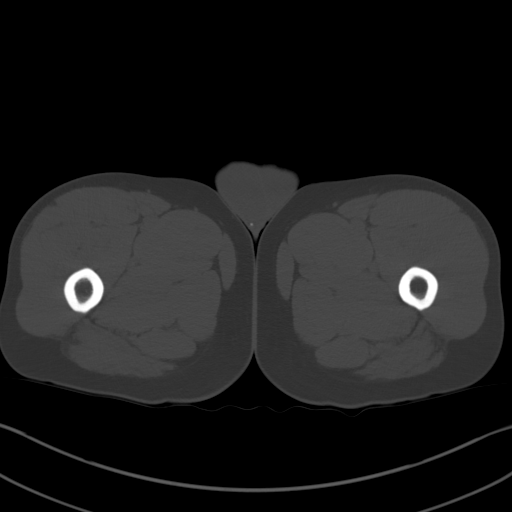
[im 15/99  soft-tissue]
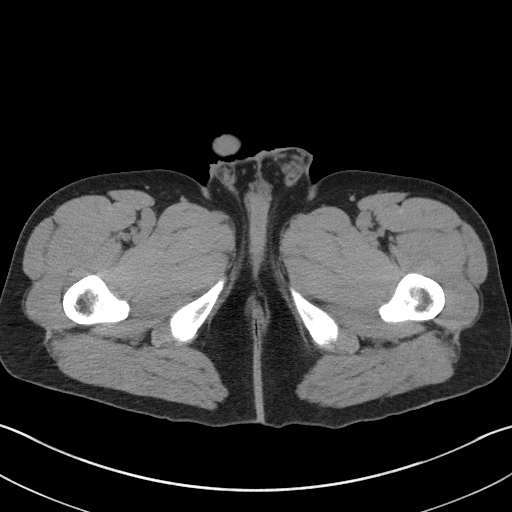
[im 20/99  soft-tissue]
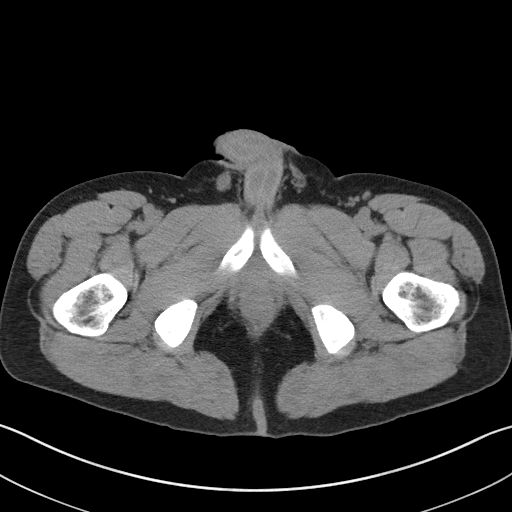
[im 30/99  soft-tissue]
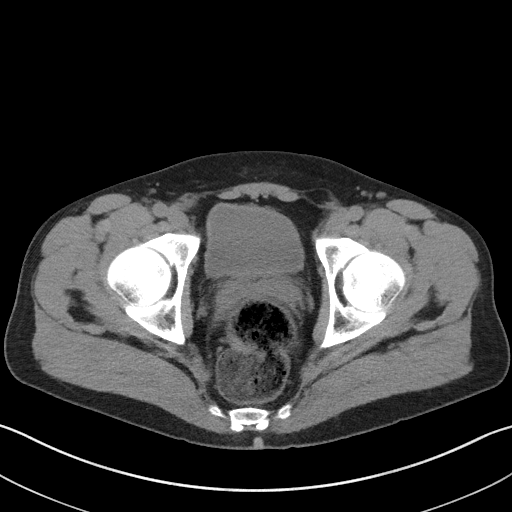
[im 35/99  soft-tissue]
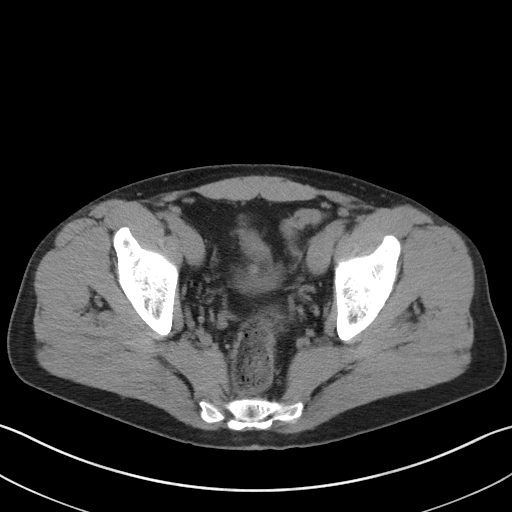
[im 45/99  soft-tissue]
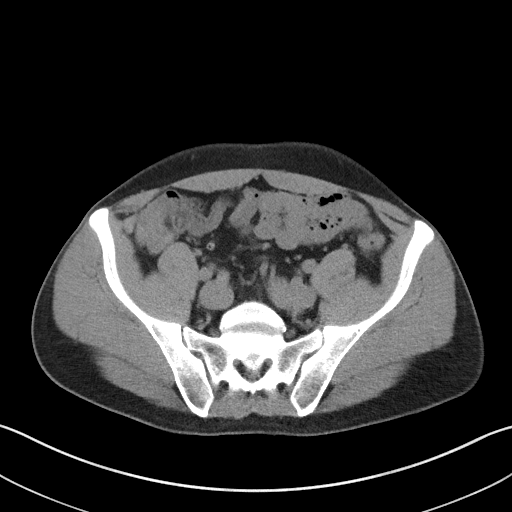
[im 50/99  soft-tissue]
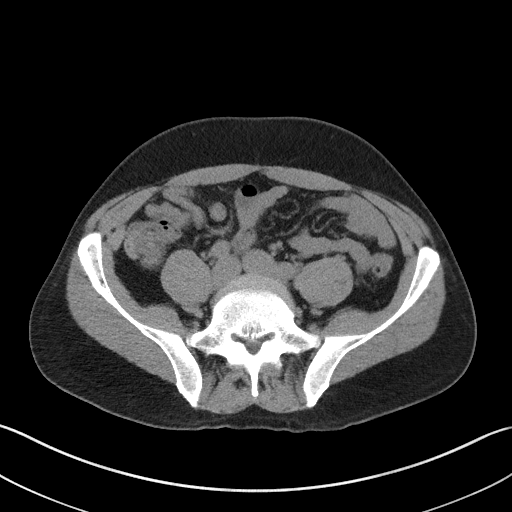
[im 54/99  soft-tissue]
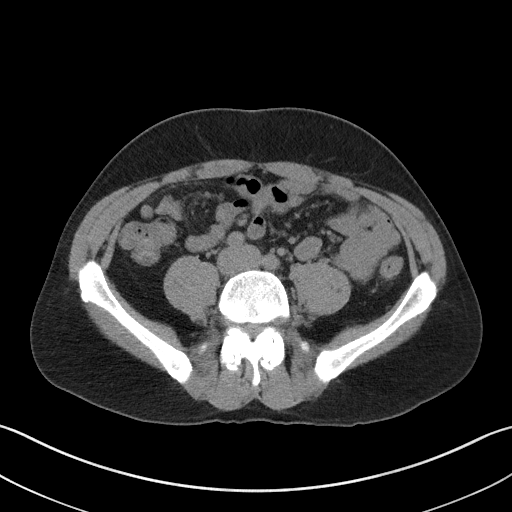
[im 64/99  soft-tissue]
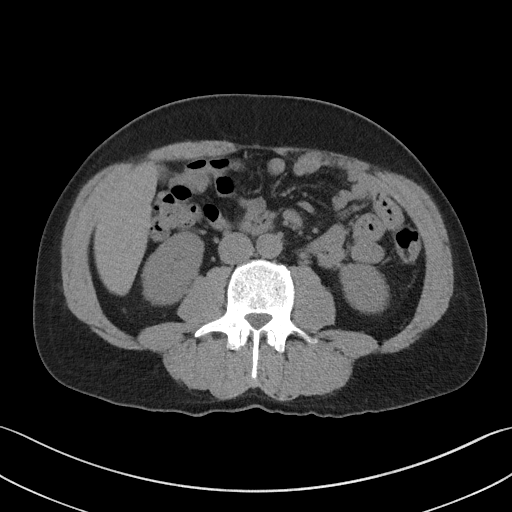
[im 64/99  bone]
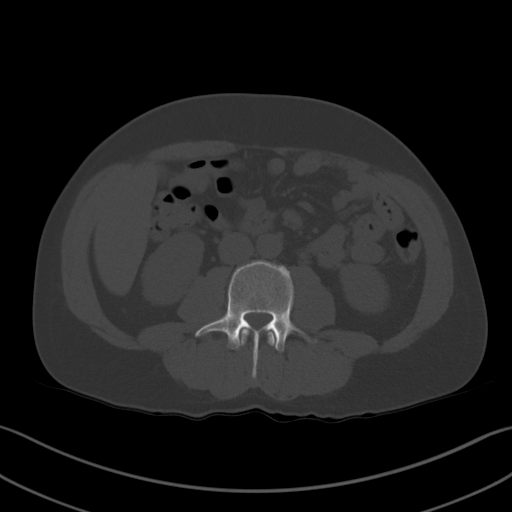
[im 69/99  soft-tissue]
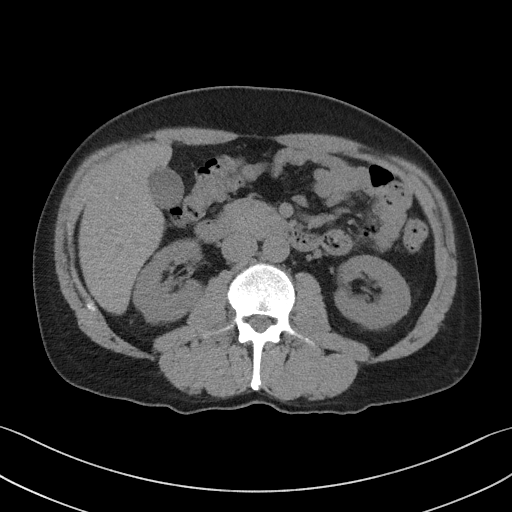
[im 79/99  soft-tissue]
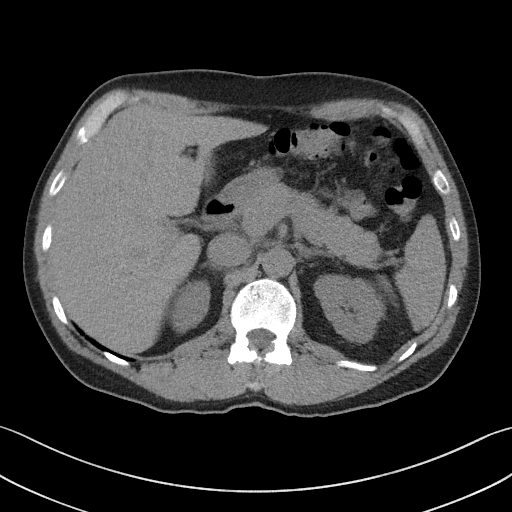
[im 84/99  soft-tissue]
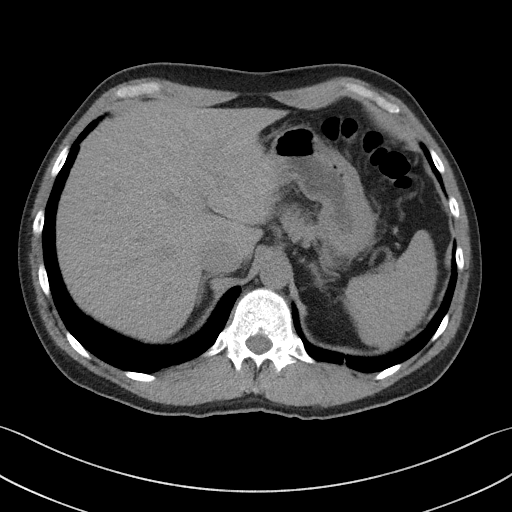
[im 94/99  soft-tissue]
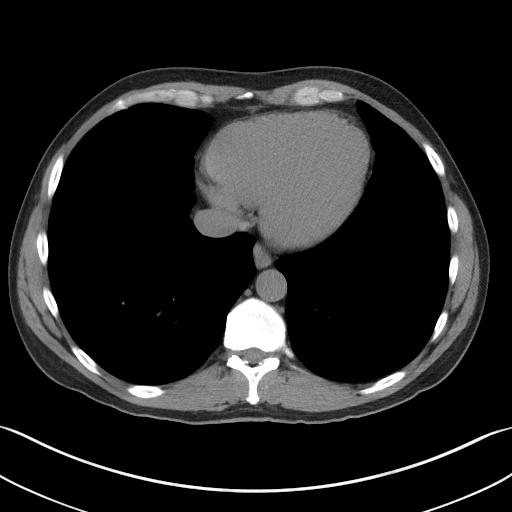

[Series 6: coronal st · coronal · 0.80mm/px · 3 of 101 slices shown]
[im 34/101  soft-tissue]
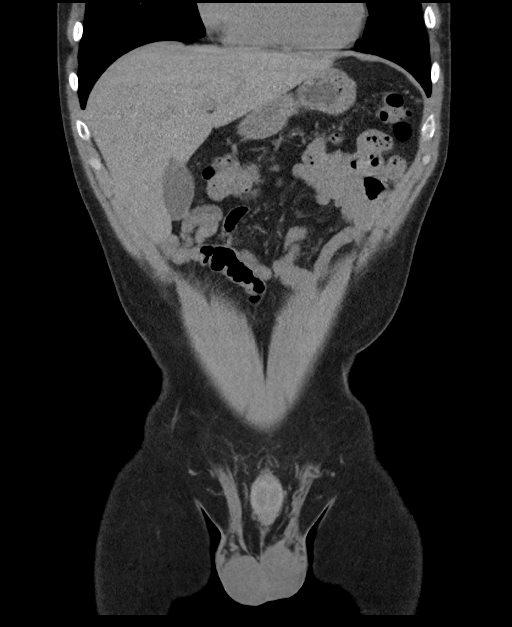
[im 45/101  soft-tissue]
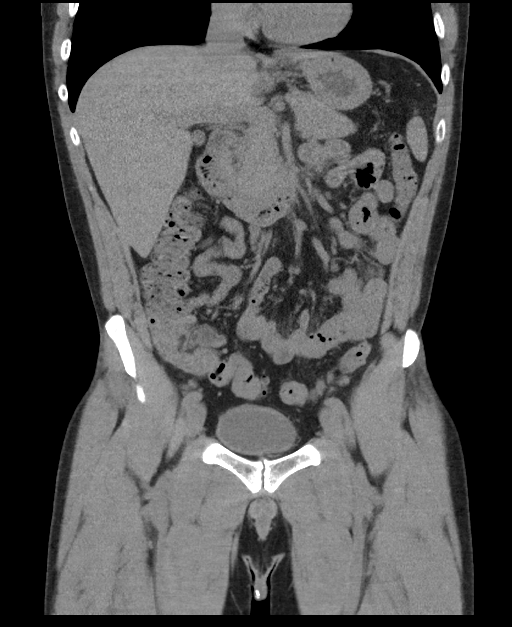
[im 56/101  soft-tissue]
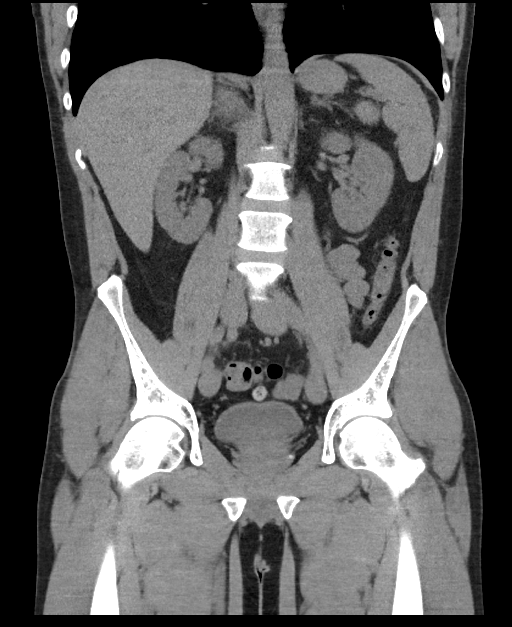

[16 of 46 positions shown; findings below may reference images not displayed]

FINDINGS: Lower chest: No acute abnormality.

Hepatobiliary: Unremarkable nonenhanced appearance of the liver and
gallbladder. No biliary ductal dilation.

Pancreas: No pancreatic ductal dilation or evidence of acute
inflammation.

Spleen: No splenomegaly. Stable benign 6 mm splenic lesion likely
reflecting a cyst or lymphangioma, requiring no imaging follow-up.

Adrenals/Urinary Tract: Bilateral adrenal glands appear normal. No
hydronephrosis. No renal, ureteral or bladder calculi identified.
Urinary bladder is unremarkable for degree of distension.

Stomach/Bowel: No radiopaque enteric contrast material was
administered. Stomach is minimally distended limiting evaluation. No
pathologic dilation of small or large bowel. The appendix and
terminal ileum appear normal. Left-sided colonic diverticulosis,
with wall thickening of a single prominent sigmoid colonic
diverticula which may reflect mild diverticulitis.

Vascular/Lymphatic: Normal caliber abdominal aorta. No
pathologically enlarged abdominal or pelvic lymph nodes.

Reproductive: Prostate gland is unremarkable.

Other: No significant abdominopelvic free fluid.

Musculoskeletal: Mild lumbar spondylosis. Mild degenerative changes
bilateral hips. No acute osseous abnormality.
IMPRESSION: 1. Left-sided colonic diverticulosis with mild wall thickening of a
single prominent sigmoid colonic diverticulum which may reflect mild
diverticulitis.
2. Otherwise no acute finding in the abdomen or pelvis, specifically
no evidence of nephrolithiasis or obstructive uropathy.

## 2023-06-30 ENCOUNTER — Other Ambulatory Visit: Payer: Self-pay | Admitting: Family Medicine

## 2023-06-30 ENCOUNTER — Encounter: Payer: Self-pay | Admitting: Family Medicine

## 2023-06-30 ENCOUNTER — Telehealth: Payer: Self-pay | Admitting: Family Medicine

## 2023-06-30 MED ORDER — PREDNISONE 20 MG PO TABS: ORAL_TABLET | ORAL | 0 refills | Status: DC

## 2023-06-30 NOTE — Telephone Encounter (Signed)
 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

## 2023-07-01 ENCOUNTER — Other Ambulatory Visit: Payer: Self-pay

## 2023-07-01 DIAGNOSIS — M546 Pain in thoracic spine: Secondary | ICD-10-CM

## 2023-07-01 DIAGNOSIS — E785 Hyperlipidemia, unspecified: Secondary | ICD-10-CM

## 2023-07-01 DIAGNOSIS — R109 Unspecified abdominal pain: Secondary | ICD-10-CM

## 2023-07-01 DIAGNOSIS — Z125 Encounter for screening for malignant neoplasm of prostate: Secondary | ICD-10-CM

## 2023-07-01 NOTE — Telephone Encounter (Signed)
 Lab orders put in, front notified to schedule appt

## 2023-07-03 ENCOUNTER — Telehealth: Payer: Self-pay | Admitting: Family Medicine

## 2023-07-03 ENCOUNTER — Encounter: Payer: Self-pay | Admitting: Family Medicine

## 2023-07-03 LAB — CMP14+EGFR
ALT: 23 [IU]/L (ref 0–44)
AST: 19 [IU]/L (ref 0–40)
Albumin: 4.3 g/dL (ref 3.8–4.9)
Alkaline Phosphatase: 56 [IU]/L (ref 44–121)
BUN/Creatinine Ratio: 11 (ref 9–20)
BUN: 11 mg/dL (ref 6–24)
Bilirubin Total: 0.7 mg/dL (ref 0.0–1.2)
CO2: 25 mmol/L (ref 20–29)
Calcium: 9.5 mg/dL (ref 8.7–10.2)
Chloride: 103 mmol/L (ref 96–106)
Creatinine, Ser: 0.96 mg/dL (ref 0.76–1.27)
Globulin, Total: 2.4 g/dL (ref 1.5–4.5)
Glucose: 95 mg/dL (ref 70–99)
Potassium: 4.5 mmol/L (ref 3.5–5.2)
Sodium: 141 mmol/L (ref 134–144)
Total Protein: 6.7 g/dL (ref 6.0–8.5)
eGFR: 94 mL/min/{1.73_m2} (ref >59)

## 2023-07-03 LAB — C-REACTIVE PROTEIN: CRP: <1 mg/L (ref 0–10)

## 2023-07-03 LAB — LIPID PANEL
Chol/HDL Ratio: 3.8 {ratio} (ref 0.0–5.0)
Cholesterol, Total: 205 mg/dL — ABNORMAL HIGH (ref 100–199)
HDL: 54 mg/dL (ref >39)
LDL Chol Calc (NIH): 138 mg/dL — ABNORMAL HIGH (ref 0–99)
Triglycerides: 74 mg/dL (ref 0–149)
VLDL Cholesterol Cal: 13 mg/dL (ref 5–40)

## 2023-07-03 LAB — CBC WITH DIFFERENTIAL/PLATELET
Basophils Absolute: 0.1 10*3/uL (ref 0.0–0.2)
Basos: 1 %
EOS (ABSOLUTE): 0.2 10*3/uL (ref 0.0–0.4)
Eos: 4 %
Hematocrit: 47.5 % (ref 37.5–51.0)
Hemoglobin: 15.9 g/dL (ref 13.0–17.7)
Immature Grans (Abs): 0 10*3/uL (ref 0.0–0.1)
Immature Granulocytes: 0 %
Lymphocytes Absolute: 1.7 10*3/uL (ref 0.7–3.1)
Lymphs: 32 %
MCH: 30.1 pg (ref 26.6–33.0)
MCHC: 33.5 g/dL (ref 31.5–35.7)
MCV: 90 fL (ref 79–97)
Monocytes Absolute: 0.5 10*3/uL (ref 0.1–0.9)
Monocytes: 9 %
Neutrophils Absolute: 2.8 10*3/uL (ref 1.4–7.0)
Neutrophils: 54 %
Platelets: 169 10*3/uL (ref 150–450)
RBC: 5.29 x10E6/uL (ref 4.14–5.80)
RDW: 12.3 % (ref 11.6–15.4)
WBC: 5.2 10*3/uL (ref 3.4–10.8)

## 2023-07-03 LAB — SEDIMENTATION RATE: Sed Rate: 2 mm/h (ref 0–30)

## 2023-07-03 LAB — LIPASE: Lipase: 39 U/L (ref 13–78)

## 2023-07-03 LAB — PSA: Prostate Specific Ag, Serum: 0.5 ng/mL (ref 0.0–4.0)

## 2023-07-03 NOTE — Telephone Encounter (Signed)
 Front staff Please reach out to the patient via his cell phone to schedule an office visit with me same day may be today or tomorrow or Monday depending on where the open slots are May use same-day thank you Back and flank pain Please see telephone message from 06/30/2023 thank you

## 2023-07-04 ENCOUNTER — Ambulatory Visit: Payer: BC Managed Care – PPO | Admitting: Family Medicine

## 2023-07-07 ENCOUNTER — Encounter: Payer: Self-pay | Admitting: Family Medicine

## 2023-07-07 ENCOUNTER — Ambulatory Visit (INDEPENDENT_AMBULATORY_CARE_PROVIDER_SITE_OTHER): Payer: BC Managed Care – PPO | Admitting: Family Medicine

## 2023-07-07 VITALS — BP 111/74 | HR 66 | Temp 98.2°F | Ht 66.0 in | Wt 167.0 lb

## 2023-07-07 DIAGNOSIS — R109 Unspecified abdominal pain: Secondary | ICD-10-CM | POA: Diagnosis not present

## 2023-07-07 DIAGNOSIS — M546 Pain in thoracic spine: Secondary | ICD-10-CM

## 2023-07-07 DIAGNOSIS — M5414 Radiculopathy, thoracic region: Secondary | ICD-10-CM | POA: Diagnosis not present

## 2023-07-07 DIAGNOSIS — G5793 Unspecified mononeuropathy of bilateral lower limbs: Secondary | ICD-10-CM | POA: Diagnosis not present

## 2023-07-07 LAB — POCT URINALYSIS DIP (CLINITEK)
Bilirubin, UA: NEGATIVE
Blood, UA: NEGATIVE
Glucose, UA: NEGATIVE mg/dL
Ketones, POC UA: NEGATIVE mg/dL
Leukocytes, UA: NEGATIVE
Nitrite, UA: NEGATIVE
POC PROTEIN,UA: NEGATIVE
Spec Grav, UA: 1.02 (ref 1.010–1.025)
Urobilinogen, UA: 0.2 U/dL
pH, UA: 6 (ref 5.0–8.0)

## 2023-07-07 NOTE — Progress Notes (Signed)
 Subjective:    Patient ID: Angel Baker, male    DOB: 1970-10-17, 53 y.o.   MRN: [id number]  Discussed the use of AI scribe software for clinical note transcription with the patient, who gave verbal consent to proceed.  History of Present Illness   Angel Baker is a 53 year old male who presents with persistent right flank pain. This has been going on for months and has had multiple spells over the past few years but worse recently. Previous CT in 2023.  He has persistent pain located primarily in the right flank area, just beneath the ribs. The pain varies in intensity, ranging from a 3 to a 6 or 7 on a pain scale, and is described as sharp at times, radiating towards the front. The pain is consistent in location, lasting for a few hours, occasionally waking him at night and making it difficult to return to sleep. Physical activity, particularly after a tough day at work involving carrying a ladder or engaging in more physical tasks, may aggravate the pain. However, his job is moderately physical, and such activities as climbing ladders occur infrequently. There is no association with eating or urination, and no burning sensation on the skin, numbness, or tingling in the area. No night sweats, fevers, or blood in his urine. A slight weight gain around his waist is noted, which his wife believes may be related to his symptoms. Leaning to the side can alleviate the pain somewhat.  He experiences numbness in his legs and feet after standing or walking for 10 to 15 minutes, which can progress to his midsection. This numbness affects his stride when running, particularly impacting his left leg, although he denies any severe cramping or weakness in the legs. He recalls having an MRI of the lumbar spine in 2022, which showed stenosis, but he is unsure if this is related to his current symptoms.         Review of Systems     Objective:    Physical Exam     General-in no acute distress Eyes-no  discharge Lungs-respiratory rate normal, CTA CV-no murmurs,RRR Extremities skin warm dry no edema Neuro grossly normal Behavior normal, alert Reflexes bilateral lower good monofilament testing good pulses in the feet good strength in legs good          Assessment & Plan:  Assessment and Plan    Right Flank Pain Chronic, intermittent, sharp pain under the ribs, radiating to the front. Severity varies from 3 to 7 on a pain scale. No associated skin changes or numbness. Possible aggravation with physical activity. No clear relation to eating or urination. No blood in urine. -Collect urine sample today to rule out hematuria. -If urine sample is normal, order thoracic spine MRI to assess for possible nerve impingement causing referred pain.  Numbness in Legs Numbness in legs after standing or walking for 10-15 minutes. Affects running stride, particularly in the left leg. No associated cramping. -No specific plan discussed in the conversation.  General Health Maintenance -Plan for annual physical examinations.     Patient has been having these problems off and on for the past couple years some days the pain is very intense other days the pain is not as intense It is worse with physical days.  Patient states he does running and after running 10 to 15 minutes on some days his legs will become difficult to control with left leg feeling weak.  Often he has to stop running.  He relates  right flank pain that radiates around toward the right side.  He has had a previous CT scan of the abdomen which was negative.  Patient does have a previous MRI from 2015 that showed thoracic spine impingement.  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  Patient has tried stretching and is tried exercises.  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  I do not feel that this is intra-abdominal.  Certainly if the thoracic MRI is negative may need to pursue CT scan of the abdomen  Please see commentary above in bold as a justification for his MRI  Patient also has elevated cholesterol profile.  Family history of cardiovascular disease.  Patient would benefit from low-dose coronary calcium score

## 2023-07-09 ENCOUNTER — Other Ambulatory Visit: Payer: Self-pay

## 2023-07-09 DIAGNOSIS — M5414 Radiculopathy, thoracic region: Secondary | ICD-10-CM

## 2023-07-09 DIAGNOSIS — Z139 Encounter for screening, unspecified: Secondary | ICD-10-CM

## 2023-07-09 DIAGNOSIS — M5410 Radiculopathy, site unspecified: Secondary | ICD-10-CM

## 2023-07-15 ENCOUNTER — Ambulatory Visit (HOSPITAL_COMMUNITY)
Admission: RE | Admit: 2023-07-15 | Discharge: 2023-07-15 | Disposition: A | Discharge status: Home / Self Care | Payer: Self-pay | Source: Ambulatory Visit | Attending: Family Medicine | Admitting: Family Medicine

## 2023-07-15 DIAGNOSIS — Z139 Encounter for screening, unspecified: Secondary | ICD-10-CM | POA: Insufficient documentation

## 2023-07-19 ENCOUNTER — Encounter: Payer: Self-pay | Admitting: Family Medicine

## 2023-07-19 ENCOUNTER — Telehealth: Payer: Self-pay | Admitting: Family Medicine

## 2023-07-19 NOTE — Telephone Encounter (Signed)
 Nurses In late February we ordered a MRI of the thoracic spine on this patient I do not see where it has been scheduled or process Please have our staff look into this let me know if any issues with getting this scheduled thank you

## 2023-07-21 ENCOUNTER — Other Ambulatory Visit: Payer: Self-pay | Admitting: Family Medicine

## 2023-07-21 ENCOUNTER — Telehealth: Payer: Self-pay | Admitting: Family Medicine

## 2023-07-21 MED ORDER — ROSUVASTATIN CALCIUM 10 MG PO TABS: 10.0000 mg | ORAL_TABLET | Freq: Every day | ORAL | 1 refills | Status: DC

## 2023-07-21 NOTE — Telephone Encounter (Signed)
 Nurses Please see previous telephone message 1.  Referral team needs to work to get his MRI approved and scheduled in regards to thoracic MRI 2.  Please order lipid and ALT-diagnosis hyperlipidemia high risk medication patient will do lab work between middle of May and in the May patient is aware 3.  Please set patient up for a wellness visit with me in the first 2 weeks of June-this will serve as follow-up to his hyperlipidemia thank you  Please send patient MyChart notification regarding his office visit for wellness in June thank you may use an open slot thank you

## 2023-07-22 NOTE — Telephone Encounter (Signed)
 Duplicate- see other message from Dr Lorin Picket

## 2023-07-23 ENCOUNTER — Other Ambulatory Visit: Payer: Self-pay

## 2023-07-23 DIAGNOSIS — Z79899 Other long term (current) drug therapy: Secondary | ICD-10-CM

## 2023-07-23 DIAGNOSIS — E785 Hyperlipidemia, unspecified: Secondary | ICD-10-CM

## 2023-07-24 ENCOUNTER — Encounter: Payer: Self-pay | Admitting: Family Medicine

## 2023-10-16 ENCOUNTER — Encounter: Payer: Self-pay | Admitting: Family Medicine

## 2023-10-17 NOTE — Telephone Encounter (Signed)
 Nurses I read through Angel Baker's message He does not need both appointments I would recommend keeping the appointment on the 16th for the physical and we can also cover his cholesterol at the same time Nurses-please cancel the June 12th appointment His lab work is already ordered he should do blood work approximately 4 to 7 days before his appointment  Please forward a message of this to [employer]. Angel Baker

## 2023-10-23 ENCOUNTER — Ambulatory Visit: Admitting: Family Medicine

## 2023-10-23 ENCOUNTER — Ambulatory Visit: Payer: Self-pay | Admitting: Family Medicine

## 2023-10-23 LAB — ALT: ALT: 22 IU/L (ref 0–44)

## 2023-10-23 LAB — LIPID PANEL
Chol/HDL Ratio: 3.3 ratio (ref 0.0–5.0)
Cholesterol, Total: 171 mg/dL (ref 100–199)
HDL: 52 mg/dL (ref >39)
LDL Chol Calc (NIH): 106 mg/dL — ABNORMAL HIGH (ref 0–99)
Triglycerides: 66 mg/dL (ref 0–149)
VLDL Cholesterol Cal: 13 mg/dL (ref 5–40)

## 2023-10-27 ENCOUNTER — Encounter: Payer: Self-pay | Admitting: Family Medicine

## 2023-10-27 ENCOUNTER — Ambulatory Visit: Admitting: Family Medicine

## 2023-10-27 VITALS — BP 125/82 | HR 56 | Temp 98.6°F | Ht 66.0 in | Wt 168.0 lb

## 2023-10-27 DIAGNOSIS — M546 Pain in thoracic spine: Secondary | ICD-10-CM | POA: Diagnosis not present

## 2023-10-27 DIAGNOSIS — E785 Hyperlipidemia, unspecified: Secondary | ICD-10-CM

## 2023-10-27 DIAGNOSIS — M5414 Radiculopathy, thoracic region: Secondary | ICD-10-CM

## 2023-10-27 DIAGNOSIS — Z0000 Encounter for general adult medical examination without abnormal findings: Secondary | ICD-10-CM

## 2023-10-27 DIAGNOSIS — Z0001 Encounter for general adult medical examination with abnormal findings: Secondary | ICD-10-CM

## 2023-10-27 DIAGNOSIS — R109 Unspecified abdominal pain: Secondary | ICD-10-CM

## 2023-10-27 DIAGNOSIS — Z79899 Other long term (current) drug therapy: Secondary | ICD-10-CM

## 2023-10-27 NOTE — Addendum Note (Signed)
 Addended by: Charlotta Cook A on: 10/27/2023 07:38 PM   Modules accepted: Orders

## 2023-10-27 NOTE — Patient Instructions (Signed)

## 2023-10-27 NOTE — Progress Notes (Signed)
 Subjective:    Patient ID: Angel Baker, male    DOB: 29-Jul-1970, 53 y.o.   MRN: [id number]  HPI The patient comes in today for a wellness visit.  Here today for wellness Having also pain in his right flank area from the kidney region around to his side it has been present for many months No nausea vomiting or diarrhea with this Does wake him up at night Also happens during the day Causes significant pain  Patient also relates mid back pain and discomfort on the regular basis Worse when he runs Numbness into the legs with running Limited in his running where he used to be able to do a lot of running   A review of their health history was completed.  A review of medications was also completed.  Any needed refills; None needed   Eating habits: normal diet  Falls/  MVA accidents in past few months: No   Regular exercise: running and walking   Specialist pt sees on regular basis: N/A  Preventative health issues were discussed.   Additional concerns: back pain still ongoing.  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   Mild chronic fracture T9.  No acute fracture.    Electronically Signed    By: Anastasio Balsam M.D.    On: 09/13/2013 19:47      Assessment & Plan:  1. Well adult exam (Primary) Adult wellness-complete.wellness physical was conducted today. Importance of diet and exercise were discussed in detail.  Importance of stress reduction and healthy living were discussed.  In addition to this a discussion regarding  safety was also covered.  We also reviewed over immunizations and gave recommendations regarding current immunization needed for age.   In addition to this additional areas were also touched on including: Preventative health exams needed:  Colonoscopy 10 years from his most previous one up-to-date currently  Patient was advised yearly wellness exam We will also order lipid and ALT patient states he has not been totally compliant with medication he would do a better job he will recheck his labs again in 3 months  2. Right flank pain Significant right flank pain discomfort that is fairly persistent.  Causing him a lot of pain and discomfort in the mid back into the side.  See discussion above.  He has tried conservative measures for well over 6 months without success.  It is reasonable to do a CT scan of the abdomen to look at this right flank area to make sure there is not an underlying kidney issue in terms of growth or tumor or make sure that there is not a liver growth as well  3. Thoracic back pain, unspecified back pain laterality, unspecified chronicity He has a history of significant back pain as well as numbness into the legs worse with running.  Previous MRI of the lumbar spine Please see previous note from back in February where we tried to get MRI of the thoracic spine He has done conservative measures over the past 4+ months with stretching anti-inflammatory medication not getting any relief of his  mid back pain it is reasonable to do an MRI Patient has history of spondylosis Also has a history of multiple small disc protrusions Given that he is having the right-sided pain has been persistent over 6 months plus also numbness in the legs with running and unable to do the distance that he normally would be able to do it is reasonable to pursue forward with the MRI of the thoracic spine It should be noted that he has a negative straight leg raise Strength of the legs overall are good I  suspect either spinal stenosis or nerve root impingement 4. Thoracic nerve root impingement Please see discussion above

## 2023-10-30 ENCOUNTER — Other Ambulatory Visit: Payer: Self-pay

## 2023-10-30 DIAGNOSIS — R109 Unspecified abdominal pain: Secondary | ICD-10-CM

## 2023-12-05 ENCOUNTER — Ambulatory Visit (HOSPITAL_COMMUNITY)
Admission: RE | Admit: 2023-12-05 | Discharge: 2023-12-05 | Disposition: A | Discharge status: Home / Self Care | Source: Ambulatory Visit | Attending: Family Medicine | Admitting: Family Medicine

## 2023-12-05 DIAGNOSIS — R109 Unspecified abdominal pain: Secondary | ICD-10-CM | POA: Insufficient documentation

## 2023-12-05 MED ORDER — IOHEXOL 300 MG/ML  SOLN
100.0000 mL | Freq: Once | INTRAMUSCULAR | Status: AC | PRN
  Administered 2023-12-05: 100 mL via INTRAVENOUS

## 2023-12-07 ENCOUNTER — Ambulatory Visit: Payer: Self-pay | Admitting: Family Medicine

## 2024-05-17 ENCOUNTER — Other Ambulatory Visit: Payer: Self-pay | Admitting: Family Medicine

## 2024-05-17 ENCOUNTER — Encounter: Payer: Self-pay | Admitting: Family Medicine

## 2024-05-17 DIAGNOSIS — E785 Hyperlipidemia, unspecified: Secondary | ICD-10-CM

## 2024-05-17 DIAGNOSIS — Z79899 Other long term (current) drug therapy: Secondary | ICD-10-CM

## 2024-05-19 ENCOUNTER — Other Ambulatory Visit: Payer: Self-pay | Admitting: Family Medicine

## 2024-05-19 ENCOUNTER — Ambulatory Visit: Payer: Self-pay | Admitting: Family Medicine

## 2024-05-19 DIAGNOSIS — E785 Hyperlipidemia, unspecified: Secondary | ICD-10-CM

## 2024-05-19 DIAGNOSIS — Z0000 Encounter for general adult medical examination without abnormal findings: Secondary | ICD-10-CM

## 2024-05-19 DIAGNOSIS — Z79899 Other long term (current) drug therapy: Secondary | ICD-10-CM

## 2024-05-19 DIAGNOSIS — M546 Pain in thoracic spine: Secondary | ICD-10-CM

## 2024-05-19 DIAGNOSIS — Z125 Encounter for screening for malignant neoplasm of prostate: Secondary | ICD-10-CM

## 2024-05-19 LAB — LIPID PANEL
Chol/HDL Ratio: 2.9 ratio (ref 0.0–5.0)
Cholesterol, Total: 172 mg/dL (ref 100–199)
HDL: 59 mg/dL
LDL Chol Calc (NIH): 103 mg/dL — ABNORMAL HIGH (ref 0–99)
Triglycerides: 50 mg/dL (ref 0–149)
VLDL Cholesterol Cal: 10 mg/dL (ref 5–40)

## 2024-05-19 LAB — ALT: ALT: 30 IU/L (ref 0–44)

## 2024-05-19 MED ORDER — ROSUVASTATIN CALCIUM 20 MG PO TABS: 20.0000 mg | ORAL_TABLET | Freq: Every day | ORAL | 1 refills | Status: AC

## 2024-05-19 NOTE — Progress Notes (Signed)
 I spoke with patient by phone Cholesterol profile not where it needs to be Goal is to get LDL below 70 and if possible below 55 Increase dose of Crestor  20 mg Patient to do labs in April with a wellness in June Patient states he is considering beet root but he will hold off on this until he sees how the next dosing does

## 2024-11-03 ENCOUNTER — Encounter: Admitting: Family Medicine
